# Patient Record
Sex: Male | Born: 1960
Health system: Southern US, Community
[De-identification: ages and names within clinical notes are randomized; demographics above are authoritative.]

## PROBLEM LIST (undated history)

## (undated) DIAGNOSIS — S59909A Unspecified injury of unspecified elbow, initial encounter: Secondary | ICD-10-CM

## (undated) DIAGNOSIS — F329 Major depressive disorder, single episode, unspecified: Secondary | ICD-10-CM

## (undated) DIAGNOSIS — G546 Phantom limb syndrome with pain: Secondary | ICD-10-CM

## (undated) DIAGNOSIS — F32A Depression, unspecified: Secondary | ICD-10-CM

## (undated) DIAGNOSIS — I10 Essential (primary) hypertension: Secondary | ICD-10-CM

## (undated) HISTORY — DX: Essential (primary) hypertension: I10

## (undated) HISTORY — DX: Depression, unspecified: F32.A

## (undated) HISTORY — DX: Unspecified injury of unspecified elbow, initial encounter: S59.909A

## (undated) HISTORY — DX: Major depressive disorder, single episode, unspecified: F32.9

## (undated) HISTORY — DX: Phantom limb syndrome with pain: G54.6

---

## 2005-07-24 ENCOUNTER — Ambulatory Visit: Payer: Self-pay | Admitting: Internal Medicine

## 2006-10-18 ENCOUNTER — Ambulatory Visit: Payer: Self-pay | Admitting: Internal Medicine

## 2006-10-19 ENCOUNTER — Telehealth (INDEPENDENT_AMBULATORY_CARE_PROVIDER_SITE_OTHER): Payer: Self-pay | Admitting: *Deleted

## 2006-10-30 ENCOUNTER — Telehealth: Payer: Self-pay | Admitting: Internal Medicine

## 2006-11-19 ENCOUNTER — Telehealth (INDEPENDENT_AMBULATORY_CARE_PROVIDER_SITE_OTHER): Payer: Self-pay | Admitting: *Deleted

## 2006-12-04 ENCOUNTER — Ambulatory Visit: Payer: Self-pay | Admitting: Internal Medicine

## 2006-12-04 LAB — CONVERTED CEMR LAB
Bacteria, UA: NEGATIVE
Ketones, ur: NEGATIVE mg/dL
Leukocytes, UA: NEGATIVE
RBC / HPF: NONE SEEN
Urobilinogen, UA: 0.2 (ref 0.0–1.0)

## 2006-12-05 ENCOUNTER — Encounter: Payer: Self-pay | Admitting: Internal Medicine

## 2006-12-05 ENCOUNTER — Ambulatory Visit: Payer: Self-pay | Admitting: Cardiology

## 2006-12-06 ENCOUNTER — Encounter (INDEPENDENT_AMBULATORY_CARE_PROVIDER_SITE_OTHER): Payer: Self-pay | Admitting: *Deleted

## 2006-12-13 ENCOUNTER — Ambulatory Visit: Payer: Self-pay | Admitting: Internal Medicine

## 2006-12-13 DIAGNOSIS — F329 Major depressive disorder, single episode, unspecified: Secondary | ICD-10-CM

## 2006-12-24 ENCOUNTER — Ambulatory Visit: Payer: Self-pay | Admitting: Internal Medicine

## 2006-12-28 ENCOUNTER — Encounter: Payer: Self-pay | Admitting: Internal Medicine

## 2007-01-02 ENCOUNTER — Encounter (INDEPENDENT_AMBULATORY_CARE_PROVIDER_SITE_OTHER): Payer: Self-pay | Admitting: *Deleted

## 2007-04-10 ENCOUNTER — Ambulatory Visit: Payer: Self-pay | Admitting: Internal Medicine

## 2007-05-16 ENCOUNTER — Telehealth (INDEPENDENT_AMBULATORY_CARE_PROVIDER_SITE_OTHER): Payer: Self-pay | Admitting: *Deleted

## 2007-06-11 ENCOUNTER — Encounter (INDEPENDENT_AMBULATORY_CARE_PROVIDER_SITE_OTHER): Payer: Self-pay | Admitting: *Deleted

## 2007-06-18 DIAGNOSIS — F411 Generalized anxiety disorder: Secondary | ICD-10-CM | POA: Insufficient documentation

## 2007-06-20 ENCOUNTER — Telehealth (INDEPENDENT_AMBULATORY_CARE_PROVIDER_SITE_OTHER): Payer: Self-pay | Admitting: *Deleted

## 2007-07-31 ENCOUNTER — Telehealth (INDEPENDENT_AMBULATORY_CARE_PROVIDER_SITE_OTHER): Payer: Self-pay | Admitting: *Deleted

## 2007-08-27 ENCOUNTER — Emergency Department (HOSPITAL_COMMUNITY): Admission: EM | Admit: 2007-08-27 | Discharge: 2007-08-27 | Payer: Self-pay | Admitting: Emergency Medicine

## 2007-09-11 ENCOUNTER — Telehealth: Payer: Self-pay | Admitting: Internal Medicine

## 2007-10-11 ENCOUNTER — Telehealth (INDEPENDENT_AMBULATORY_CARE_PROVIDER_SITE_OTHER): Payer: Self-pay | Admitting: *Deleted

## 2007-11-29 ENCOUNTER — Ambulatory Visit: Payer: Self-pay | Admitting: Internal Medicine

## 2007-12-23 ENCOUNTER — Telehealth: Payer: Self-pay | Admitting: Internal Medicine

## 2008-01-19 ENCOUNTER — Emergency Department: Payer: Self-pay | Admitting: Emergency Medicine

## 2008-01-19 ENCOUNTER — Encounter: Payer: Self-pay | Admitting: Family Medicine

## 2008-01-20 ENCOUNTER — Ambulatory Visit: Payer: Self-pay | Admitting: Family Medicine

## 2008-01-20 ENCOUNTER — Encounter (INDEPENDENT_AMBULATORY_CARE_PROVIDER_SITE_OTHER): Payer: Self-pay | Admitting: *Deleted

## 2008-01-21 ENCOUNTER — Ambulatory Visit: Payer: Self-pay | Admitting: Cardiovascular Disease

## 2008-01-21 ENCOUNTER — Encounter: Payer: Self-pay | Admitting: Family Medicine

## 2008-01-24 ENCOUNTER — Ambulatory Visit: Payer: Self-pay | Admitting: Family Medicine

## 2008-01-24 DIAGNOSIS — S59909A Unspecified injury of unspecified elbow, initial encounter: Secondary | ICD-10-CM

## 2008-01-24 HISTORY — DX: Unspecified injury of unspecified elbow, initial encounter: S59.909A

## 2008-01-29 ENCOUNTER — Ambulatory Visit: Payer: Self-pay | Admitting: Family Medicine

## 2008-02-14 ENCOUNTER — Telehealth: Payer: Self-pay | Admitting: Internal Medicine

## 2008-05-14 ENCOUNTER — Ambulatory Visit: Payer: Self-pay | Admitting: Family Medicine

## 2008-05-15 ENCOUNTER — Encounter: Payer: Self-pay | Admitting: Family Medicine

## 2008-05-15 ENCOUNTER — Ambulatory Visit: Payer: Self-pay | Admitting: Family Medicine

## 2008-05-21 ENCOUNTER — Ambulatory Visit: Payer: Self-pay | Admitting: Family Medicine

## 2008-07-13 ENCOUNTER — Telehealth: Payer: Self-pay | Admitting: Internal Medicine

## 2008-08-20 ENCOUNTER — Ambulatory Visit: Payer: Self-pay | Admitting: Family Medicine

## 2008-08-20 DIAGNOSIS — Z720 Tobacco use: Secondary | ICD-10-CM | POA: Insufficient documentation

## 2008-08-20 DIAGNOSIS — L738 Other specified follicular disorders: Secondary | ICD-10-CM

## 2008-08-21 ENCOUNTER — Telehealth (INDEPENDENT_AMBULATORY_CARE_PROVIDER_SITE_OTHER): Payer: Self-pay | Admitting: Internal Medicine

## 2008-10-08 ENCOUNTER — Telehealth: Payer: Self-pay | Admitting: Internal Medicine

## 2008-11-06 ENCOUNTER — Telehealth (INDEPENDENT_AMBULATORY_CARE_PROVIDER_SITE_OTHER): Payer: Self-pay | Admitting: Internal Medicine

## 2009-01-15 ENCOUNTER — Telehealth: Payer: Self-pay | Admitting: Internal Medicine

## 2009-09-19 IMAGING — CT CT EXTREM UP W/O CM*L*
2 of 3 series · 17 of 46 positions shown, 19 images · non-contrast
Comparison: None available

CLINICAL DATA: Elbow fracture.  Severe pain.  Swelling.

CT LEFT ELBOW WITHOUT CONTRAST
TECHNIQUE: Multidetector CT imaging of the left elbow was
performed according to the standard protocol without intravenous
contrast. Multiplanar CT image reconstructions were also generated.

[Series 3: pelvis_hip 3.0 b60f st · axial · 0.28mm/px · z∈[-152,-30]mm · 14 of 47 slices shown, 16 images]
[im 3/47  soft-tissue]
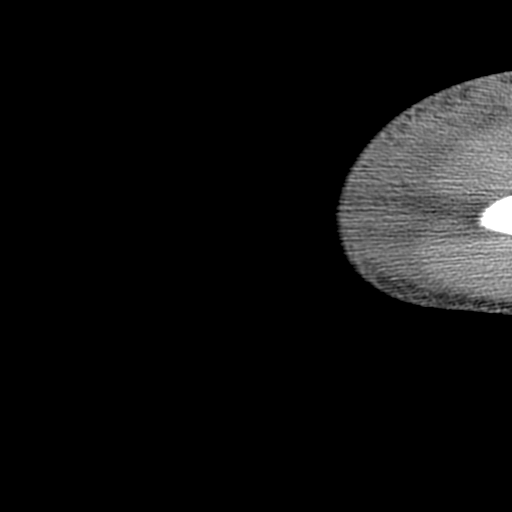
[im 3/47  bone]
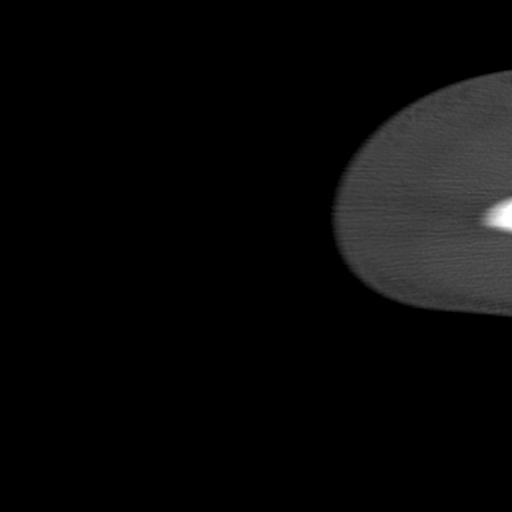
[im 6/47  soft-tissue]
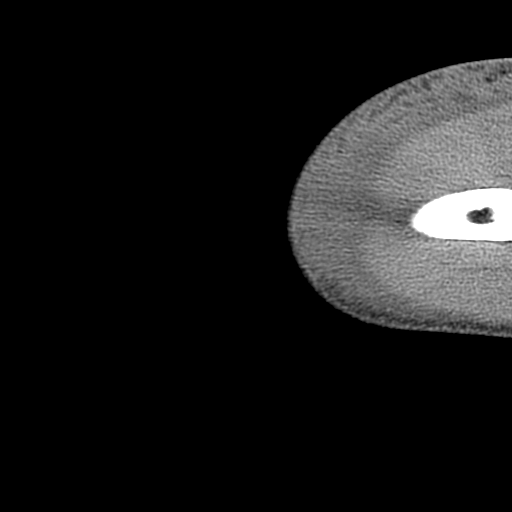
[im 9/47  soft-tissue]
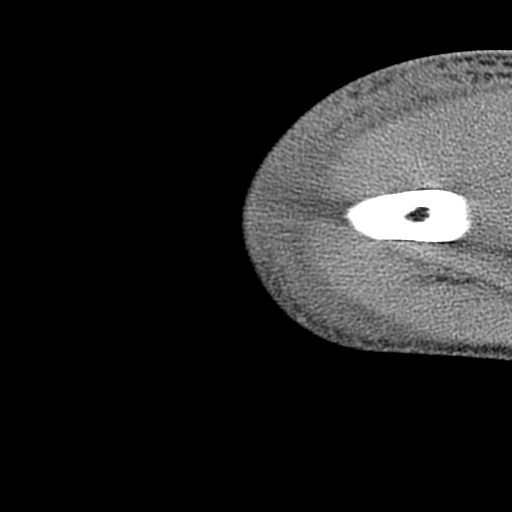
[im 12/47  soft-tissue]
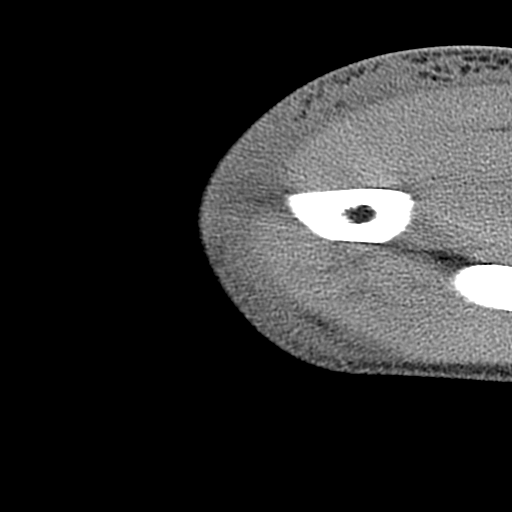
[im 15/47  soft-tissue]
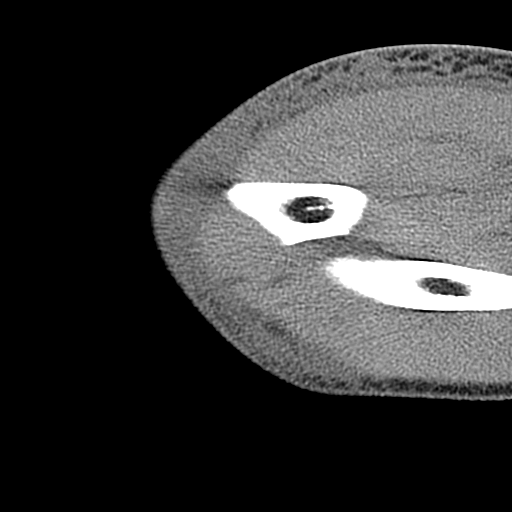
[im 18/47  soft-tissue]
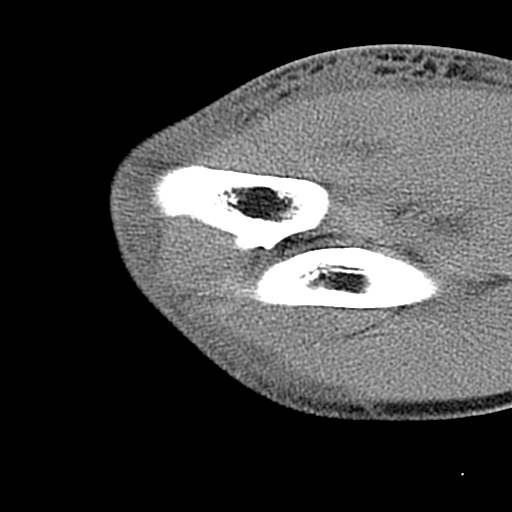
[im 21/47  soft-tissue]
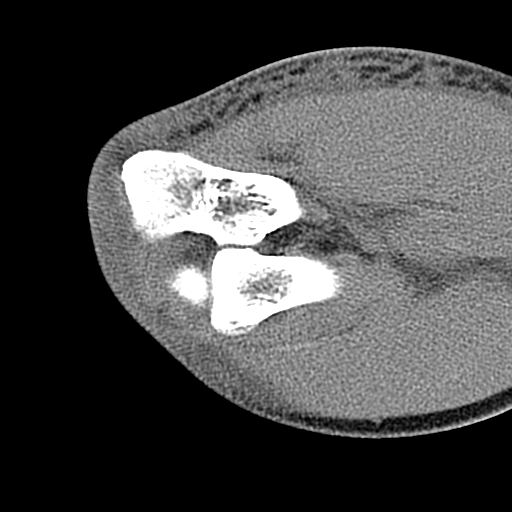
[im 26/47  soft-tissue]
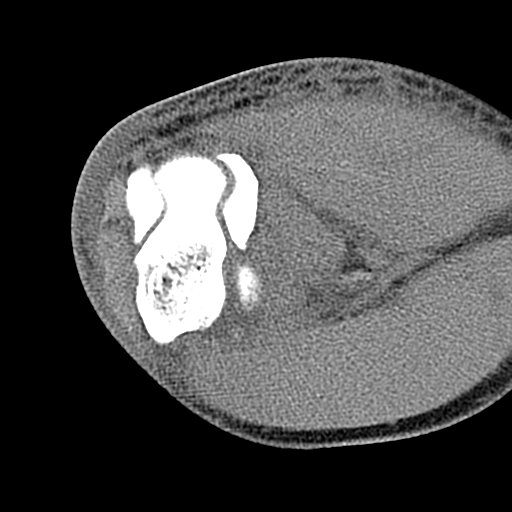
[im 29/47  soft-tissue]
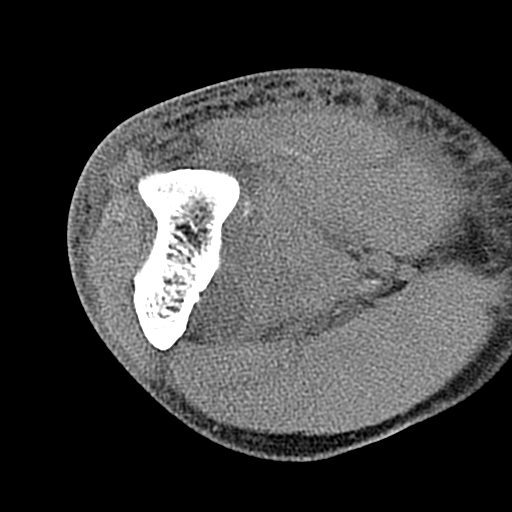
[im 29/47  bone]
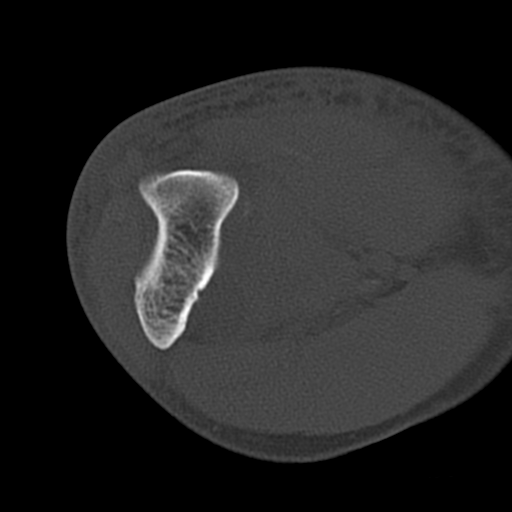
[im 32/47  soft-tissue]
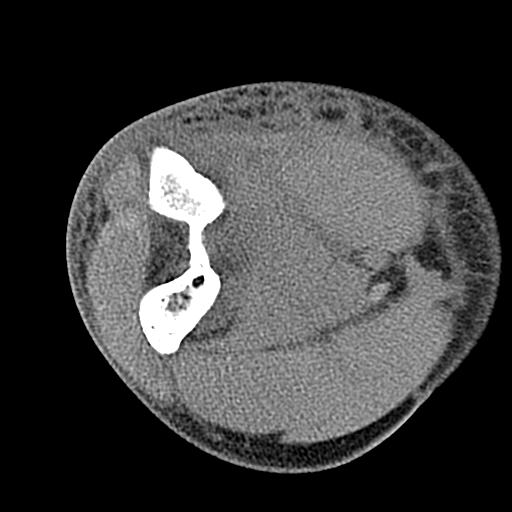
[im 35/47  soft-tissue]
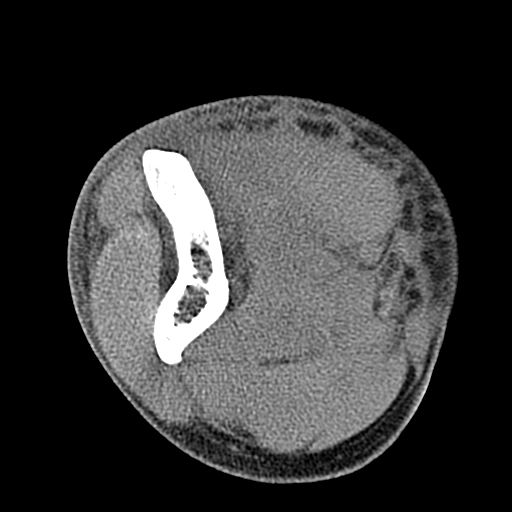
[im 38/47  soft-tissue]
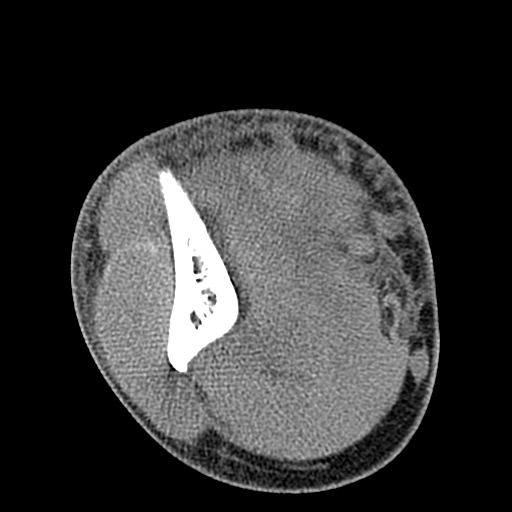
[im 41/47  soft-tissue]
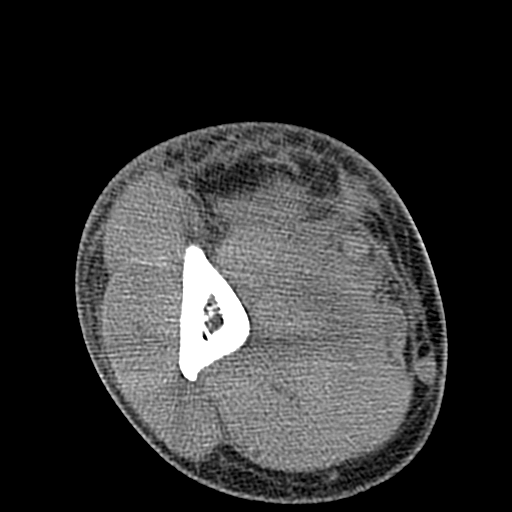
[im 44/47  soft-tissue]
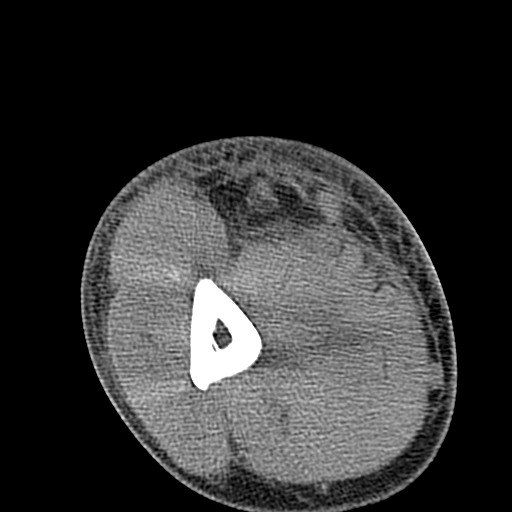

[Series 602: sagittal left elbow · coronal · 0.29mm/px · 3 of 62 slices shown]
[im 21/62  soft-tissue]
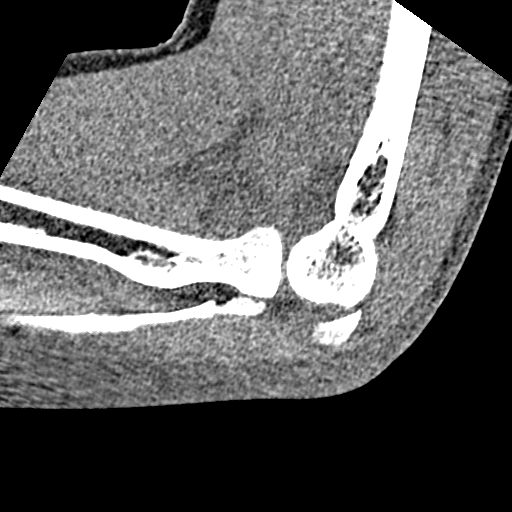
[im 28/62  soft-tissue]
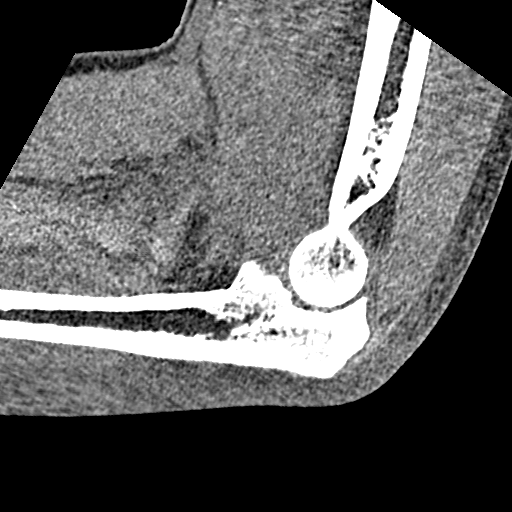
[im 34/62  soft-tissue]
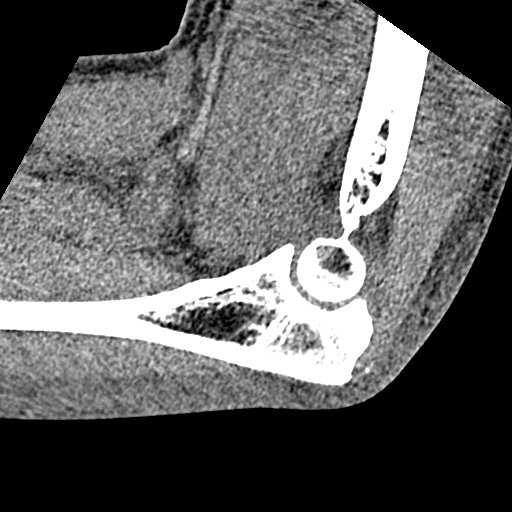

[17 of 46 positions shown; findings below may reference images not displayed]

FINDINGS: Bones are intact.  No fracture is identified.  Tiny
amount of calcium is present in the distal triceps tendon at the
insertion on the olecranon, most consistent with enthesopathy.  No
significant elbow effusion.  Fluid is present in the subcutaneous
tissues of the anterior, ulnar, and posterior aspect of the elbow.
Grossly the triceps and biceps tendons appear within normal limits
although CT is limited in evaluation of soft tissues.  If  ligament
or tendinous injury is suspected, MRI should be obtained.  Edema is
present around the cubital tunnel.  No mass lesion is present.  No
subcutaneous hematoma.
IMPRESSION: No fracture.  Subcutaneous edema and soft tissue swelling about the
elbow.

## 2009-10-27 ENCOUNTER — Encounter: Payer: Self-pay | Admitting: Family Medicine

## 2009-10-27 ENCOUNTER — Ambulatory Visit: Payer: Self-pay | Admitting: Family Medicine

## 2010-04-28 NOTE — Letter (Signed)
Summary: Out of Work  [facility]  [address] [place], [place] [address]   Phone: [phone]  Fax: [phone]    October 27, 2009   Employee:  NYKOLAS BACALLAO    To Whom It May Concern:   For Medical reasons, please excuse the above named employee from work for the following dates:  Start:  October 26, 2009   End:  October 27, 2009   If you need additional information, please feel free to contact our office.         Sincerely,    Eustaquio Boyden  MD

## 2010-04-28 NOTE — Assessment & Plan Note (Signed)
Summary: ABD PAIN   Vital Signs:  Patient profile:   50 year old male Height:      69 inches Weight:      168.75 pounds BMI:     25.01 Temp:     97.9 degrees F oral Pulse rate:   72 / minute Pulse rhythm:   regular BP sitting:   142 / 72  (left arm) Cuff size:   regular  Vitals Entered By: Selena Batten Dance CMA Duncan Dull) (October 27, 2009 12:26 PM) CC: Left shoulder pain x 3 weeks   History of Present Illness: CC: L rib pain  3-4 wk h/o left rib pain below axilla and scapula.  No noted food/change that started pain.  Characterized as soreness below ribs.  Feels like reflux/gas, needs to burp.  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  Improved today, but stayed at home on couch yesterday.  Tums doesn't help.  Alka seltzer helps for a few seconds.  Gets worse throughout day, at times positional (worse with reaching left posteriorly).  Thinks diazepam helped in past.  Wonders if caused by stress.  Diet - chicken, bread (whole wheat).  Low on fruits and vegetables.  Does drink water or V8 instead of sodas.  No fevers, chills, abd pain otherwise, n/v/d/c.  No weight changes, appetite changes.  Nl urination.  No blood in stool/urine.  No chest pain/pressure/tightness, cough, SOB.  Previously had similar pain, went to ER and had rapid improvement with gi cocktail.  Smokes 1 ppd.  EtOH on weekends.  Did have colonoscopy 2008 per pt WNL.  Current Medications (verified): 1)  Ranitidine Hcl 150 Mg Caps (Ranitidine Hcl) .... One Twice Daily For Reflux 2)  Gas-X Extra Strength 125 Mg Chew (Simethicone) .... One After Meals As Needed  Allergies: 1)  ! * Cortisporin Ear Drops PMH reviewed for relevance  Review of Systems       per HPI  Physical Exam  General:  alert, well-developed, well-nourished, and well-hydrated.   Chest Wall:  No deformities, masses, tenderness or gynecomastia noted.  No tenderness currently on palpation of left lateral ribs Lungs:  normal respiratory effort, no intercostal  retractions, no accessory muscle use, normal breath sounds, no crackles, and no wheezes.   Heart:  Normal rate and regular rhythm. S1 and S2 normal without gallop, murmur, click, rub or other extra sounds. Abdomen:  Bowel sounds positive,abdomen soft and non-tender without masses, organomegaly or hernias noted.  Negative murphy. Extremities:  No clubbing, cyanosis, edema, or deformity noted with normal full range of motion of all joints.     Impression & Recommendations:  Problem # 1:  ABDOMINAL PAIN (ICD-789.00) likely indigestion/bloating with referred pain, treat with ranitidine/simethicone (gas-x OTC).  RTC in 2 wks if not better, sooner if worsening.  Red flags to return sooner discussed.  If not improved on current regimen, would consider trial of valium both for anxiety and as MR (in case pulled intercostal, although exam not impressive for that today).  advised not to increase fiber during acute flare, but consider in future.  His updated medication list for this problem includes:    Gas-x Extra Strength 125 Mg Chew (Simethicone) ..... One after meals as needed  Complete Medication List: 1)  Ranitidine Hcl 150 Mg Caps (Ranitidine hcl) .... One twice daily for reflux 2)  Gas-x Extra Strength 125 Mg Chew (Simethicone) .... One after meals as needed  Patient Instructions: 1)  I think you have bad indigestion irritating your stomache. 2)  Ranitidine twice daily and mylanta daily for 10 days, then as needed. 3)  If no improvmenet come back to see [place].  See [place] sooner if pain is worsening, if you start having fevers/ vomiting. 4)  Pleasure to see you today! 5)  Call clinic with questions. Prescriptions: RANITIDINE HCL 150 MG CAPS (RANITIDINE HCL) one twice daily for reflux  #30 x 0   Entered and Authorized by:   Eustaquio Boyden  MD   Signed by:   Eustaquio Boyden  MD on 10/27/2009   Method used:   Electronically to        [facility] [address])       [address], [address]       [place], [place]  [address]       Ph: [phone]       Fax: [phone]   RxID:   [id number] GAS-X EXTRA STRENGTH 125 MG CHEW (SIMETHICONE) one after meals as needed  #30 x 0   Entered and Authorized by:   Eustaquio Boyden  MD   Signed by:   Eustaquio Boyden  MD on 10/27/2009   Method used:   Electronically to        [facility] [address] (retail)       [address], [address]       [place], [place]  [address]       Ph: [phone]       Fax: [phone]   RxID:   [id number]   Prior Medications (reviewed today): None Current Allergies (reviewed today): ! * CORTISPORIN EAR DROPS

## 2010-05-17 ENCOUNTER — Ambulatory Visit (INDEPENDENT_AMBULATORY_CARE_PROVIDER_SITE_OTHER): Payer: PRIVATE HEALTH INSURANCE | Admitting: Internal Medicine

## 2010-05-17 ENCOUNTER — Encounter: Payer: Self-pay | Admitting: Internal Medicine

## 2010-05-17 DIAGNOSIS — F411 Generalized anxiety disorder: Secondary | ICD-10-CM

## 2010-05-17 DIAGNOSIS — F329 Major depressive disorder, single episode, unspecified: Secondary | ICD-10-CM

## 2010-05-17 DIAGNOSIS — L738 Other specified follicular disorders: Secondary | ICD-10-CM

## 2010-05-24 ENCOUNTER — Encounter: Payer: Self-pay | Admitting: Internal Medicine

## 2010-05-24 LAB — HM COLONOSCOPY

## 2010-05-24 NOTE — Assessment & Plan Note (Signed)
Summary: FOLLOW UP ON DEPRESSION  MEDICATION...   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 Nervous about flying  Divorced for some time Friends with ex and good relationship  Some degree of anhedonia works 6-7 days per week Stays tired Gets so "wound up" he can't wind down Will take a beer and it does calm him some but he doesn't want this every night  No temper problems no persistent depressed mood  having sore areas in axilla used cream in the past  Allergies: 1)  ! * Cortisporin Ear Drops  Past History:  Past medical, surgical, family and social histories (including risk factors) reviewed for relevance to current acute and chronic problems.  Past Medical History: Reviewed history from 01/24/2008 and no changes required. Depression L elbow injury, 01/24/08  Family History: Reviewed history from 06/06/2007 and no changes required. Father: Alive, ? BPH Mother: Alive Siblings: 2 brothers living, one deceased at age 39 with melanoma No CAD, HTN, DM Prostate cancer ?? 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  NAD Chest Wall:  no axillary masses Slight irritation but no  folliculitis Psych:  normally interactive, not anxious appearing, and slightly anxious.  Not really great eye contact but open, normal conversation   Impression & Recommendations:  Problem # 1:  ANXIETY (ICD-300.00) Assessment Deteriorated  having more problems some help from meds in past Lots of job stress  will restart the fluoxetine diazepam for as needed only (had used it qAM in past)  His updated medication list for this problem includes:    Fluoxetine Hcl 20 Mg Caps (Fluoxetine hcl) .Marland Kitchen... 1 daily to help nerves    Diazepam 5 Mg Tabs (Diazepam) .Marland Kitchen... 1/2-1 tab by mouth two times a day as needed for nerves  Problem # 2:  DEPRESSION (ICD-311) Assessment: Comment Only  may have ongoing issues with this but he denies may just be secondary to anxiety will restart the fluoxetine  His updated medication list for this problem includes:    Fluoxetine Hcl 20 Mg Caps (Fluoxetine hcl) .Marland Kitchen... 1 daily to help nerves    Diazepam 5 Mg Tabs (Diazepam) .Marland Kitchen... 1/2-1 tab by mouth two times a day as needed for nerves  Problem # 3:  FOLLICULITIS (ICD-704.8) Assessment: Comment Only no major findings now discussed deodorant, etc to minimize irritation  Complete Medication List: 1)  Fluoxetine Hcl 20 Mg Caps (Fluoxetine hcl) .Marland Kitchen.. 1 daily to help nerves 2)  Diazepam 5 Mg Tabs (Diazepam) .... 1/2-1 tab by mouth two times a day as needed for nerves  Patient Instructions: 1)  Please schedule a  follow-up appointment in 1 month.  2)  Call if you have any problems back on the medications Prescriptions: DIAZEPAM 5 MG TABS (DIAZEPAM) 1/2-1 tab by mouth two times a day as needed for nerves  #60 x 0   Entered and Authorized by:   Cindee Salt MD   Signed by:   Cindee Salt MD on 05/17/2010   Method used:   Print then Give to Patient   RxID:   [id number] FLUOXETINE HCL 20 MG CAPS (FLUOXETINE HCL) 1 daily to help nerves  #30 x 5   Entered and Authorized by:   Cindee Salt MD    Signed by:   Cindee Salt MD on 05/17/2010   Method used:   Electronically to        [facility] [address] ([facility]ail)       [address], [address]       [place], [place]  [address]       Ph: [phone]       Fax: [phone]   RxID:   [id number]    Orders Added: 1)  Est. Patient Level IV [59563]    Prior Medications: Current Allergies (reviewed today): ! * CORTISPORIN EAR DROPS

## 2010-06-15 ENCOUNTER — Ambulatory Visit: Payer: PRIVATE HEALTH INSURANCE | Admitting: Internal Medicine

## 2010-07-08 ENCOUNTER — Ambulatory Visit: Payer: PRIVATE HEALTH INSURANCE | Admitting: Internal Medicine

## 2010-07-13 ENCOUNTER — Other Ambulatory Visit: Payer: Self-pay | Admitting: *Deleted

## 2010-07-13 MED ORDER — DIAZEPAM 5 MG PO TABS: ORAL_TABLET | ORAL | Status: DC

## 2010-07-13 NOTE — Telephone Encounter (Signed)
rx called into pharmacy

## 2010-07-13 NOTE — Telephone Encounter (Signed)
Okay #60 x 0 

## 2010-07-20 ENCOUNTER — Ambulatory Visit (INDEPENDENT_AMBULATORY_CARE_PROVIDER_SITE_OTHER): Payer: PRIVATE HEALTH INSURANCE | Admitting: Internal Medicine

## 2010-07-20 ENCOUNTER — Encounter: Payer: Self-pay | Admitting: Internal Medicine

## 2010-07-20 VITALS — BP 118/70 | HR 78 | Temp 97.7°F | Ht 69.0 in | Wt 172.0 lb

## 2010-07-20 DIAGNOSIS — F411 Generalized anxiety disorder: Secondary | ICD-10-CM

## 2010-07-20 DIAGNOSIS — F3289 Other specified depressive episodes: Secondary | ICD-10-CM

## 2010-07-20 DIAGNOSIS — F329 Major depressive disorder, single episode, unspecified: Secondary | ICD-10-CM

## 2010-07-20 NOTE — Patient Instructions (Signed)
Please try the fluoxetine again---take it every other day. Stick with it for at least 2 weeks--I expect any side effects like heartburn should fade away

## 2010-07-20 NOTE — Progress Notes (Signed)
  Subjective:    Patient ID: Dylan Mckenzie, male    DOB: 1961-01-25, 50 y.o.   MRN: [id number]  HPI Feels a lot better Nerves are much improved "I just go with the flow" Taking diazepam only as needed--about 1/2 per day on average  Took the fluoxetine for 1 week only Felt it caused him AM heartburn  Work is better Didn't have to go to [place]  Still not depressed  Current outpatient prescriptions:diazepam (VALIUM) 5 MG tablet, 1/2 to 1 tablet by mouth two times a day as needed for nerves, Disp: 60 tablet, Rfl: 0;  FLUoxetine (PROZAC) 20 MG capsule, Take 20 mg by mouth daily.  , Disp: , Rfl:   Past Medical History  Diagnosis Date  . Depression   . Elbow injury 01/24/2008    left    No past surgical history on file.  Family History  Problem Relation Age of Onset  . Benign prostatic hyperplasia Father   . Cancer Father     prostate  . Cancer Brother 30    melanoma    History   Social History  . Marital Status: Married    Spouse Name: N/A    Number of Children: N/A  . Years of Education: N/A   Occupational History  . 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 Smoking status: Current Everyday Smoker  . Smokeless tobacco: Not on file  . Alcohol Use: Yes  . Drug Use:   . Sexually Active:    Other Topics Concern  . Not on file   Social History Narrative  . No narrative on file   Review of Systems Sleeping well Appetite has improved    Objective:   Physical Exam  Constitutional: He appears well-developed and well-nourished. No distress.  Psychiatric: He has a normal mood and affect. His behavior is normal. Judgment and thought content normal.          Assessment & Plan:

## 2010-08-09 NOTE — Assessment & Plan Note (Signed)
[facility]                         GASTROENTEROLOGY OFFICE NOTE   INDIO, SANTILLI                     MRN:          [id number]  DATE:12/04/2006                            DOB:          [birth year]    REFERRING PHYSICIAN:  Karie Schwalbe, MD   OFFICE CONSULTATION NOTE   REASON FOR CONSULTATION:  Lower abdominal pain.   HISTORY:  This is a 50 year old white male with no significant past  medical history who is referred through the courtesy of Dr. Alphonsus Sias  regarding abdominal pain.  The patient reports developing mid upper  abdominal pain about 2 months ago.  He was seen in Dr. Karle Starch office  October 18, 2006.  At that time, the pain was described as groin pain.   PHYSICAL EXAM:  Remarkable only for moderate prostate tenderness.  He  was treated with ciprofloxacin 500 mg b.i.d. for 3 weeks.  The patient  states the upper abdominal discomfort resolved.  However, since that  time, he has had bilateral lower abdominal discomfort.  The pain is  exacerbated when he stands on his feet for long periods of time, such as  at work.  It is better when he lies flat on his back.  His discomfort is  not effected by meals, defecation, urination, or other activities.  He  is here with his girlfriend who confirms that his problem is quite  severe.  He did contact the on-call doctor the day after his office  visit with Dr. Alphonsus Sias, who suggested emergency room evaluation.  The  patient did not feel his problem was quite that severe.  The patient  denies weight loss or rectal bleeding.   PAST MEDICAL HISTORY:  None.   PAST SURGICAL HISTORY:  None.   ALLERGIES:  None.   CURRENT MEDICATIONS:  None.   FAMILY HISTORY:  Negative for gastrointestinal malignancy.   SOCIAL HISTORY:  The patient is divorced with 1 daughter.  He lives  alone.  He is accompanied by his girlfriend.  He has a high school  education.  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   Smokes a pack  of cigarettes a day.  Drinks alcohol in the evenings and weekends.   REVIEW OF SYSTEMS:  Problems with anxiety and depression.   PHYSICAL EXAM:  Pleasant, well-appearing male in no acute distress.  Blood pressure 114/76, heart rate is 64 and regular, weight 156 pounds.  He is 5 feet 8 inches in height.  HEENT:  Sclerae anicteric, conjunctivae pink.  Oral mucosa intact.  No  adenopathy.  LUNGS:  Clear.  HEART:  Regular.  ABDOMEN:  Soft with complaints of tenderness to palpation in the lower  quadrants bilaterally as well as the suprapubic region.  RECTAL:  Omitted (performed in July).  EXTREMITIES:  Without edema.   IMPRESSION:  A 50 year old with persistent lower abdominal and pelvic  discomfort of uncertain etiology.  Questionable response to  ciprofloxacin.  Today's exam is remarkable for discomfort to palpation,  though no rebound or mass.  Question primary gastrointestinal process.  Question urologic process.   RECOMMENDATIONS:  1. Urinalysis.  2. Contrast-enhanced CT scan of the abdomen and pelvis.  3. Colonoscopy.  The nature of the procedure, as well as the risks,      benefits, and alternatives were discussed in detail.  He understood      and agreed to proceed.  4. If the above evaluation is negative, then return to Dr. Alphonsus Sias for      consideration of other etiologies.     Wilhemina Bonito. Marina Goodell, MD  Electronically Signed    JNP/MedQ  DD: 12/04/2006  DT: 12/04/2006  Job #: [id number]   cc:   Karie Schwalbe, MD

## 2010-08-12 NOTE — Assessment & Plan Note (Signed)
[facility]                                 ON-CALL NOTE   Dylan Mckenzie, Dylan Mckenzie                       MRN:          [id number]  DATE:10/19/2006                            DOB:          06/02/1960    TIME OF CALL:  7:17pm   CALLER:  Mrs. Antillon.   PRIMARY CARE PHYSICIAN:  Dr. Alphonsus Sias.   TELEPHONE NUMBER:  [phone]   The patient has had severe abdominal pain for several days and it is  much worse tonight. There is some nausea and vomiting, but no fever. He  was seen in the office several days ago and given some type of  antibiotics, however he is much worse tonight and is becoming quite  weak. My advice is to take him to the emergency room immediately.     Tera Mater. Clent Ridges, MD  Electronically Signed    SAF/MedQ  DD: 10/19/2006  DT: 10/21/2006  Job #: [id number]

## 2010-08-30 ENCOUNTER — Other Ambulatory Visit: Payer: Self-pay | Admitting: *Deleted

## 2010-08-30 MED ORDER — DIAZEPAM 5 MG PO TABS: ORAL_TABLET | ORAL | Status: DC

## 2010-08-30 NOTE — Telephone Encounter (Signed)
rx faxed to pharmacy manually  

## 2010-11-01 ENCOUNTER — Ambulatory Visit: Payer: PRIVATE HEALTH INSURANCE | Admitting: Internal Medicine

## 2010-11-01 DIAGNOSIS — Z0289 Encounter for other administrative examinations: Secondary | ICD-10-CM

## 2010-11-21 ENCOUNTER — Other Ambulatory Visit: Payer: Self-pay | Admitting: *Deleted

## 2010-11-21 MED ORDER — DIAZEPAM 5 MG PO TABS: ORAL_TABLET | ORAL | Status: DC

## 2010-11-21 NOTE — Telephone Encounter (Signed)
Form on your desk  

## 2010-11-21 NOTE — Telephone Encounter (Signed)
rx faxed to pharmacy manually  

## 2010-11-21 NOTE — Telephone Encounter (Signed)
Okay #60 x 0 Needs to schedule follow up in the next 1-2 months

## 2011-01-27 ENCOUNTER — Other Ambulatory Visit: Payer: Self-pay | Admitting: *Deleted

## 2011-01-27 MED ORDER — DIAZEPAM 5 MG PO TABS: ORAL_TABLET | ORAL | Status: DC

## 2011-01-27 NOTE — Telephone Encounter (Signed)
Okay #60 x 0 

## 2011-01-27 NOTE — Telephone Encounter (Signed)
rx called into pharmacy

## 2011-03-28 HISTORY — PX: OTHER SURGICAL HISTORY: SHX169

## 2011-04-03 ENCOUNTER — Ambulatory Visit: Payer: PRIVATE HEALTH INSURANCE | Admitting: Family Medicine

## 2011-04-03 ENCOUNTER — Ambulatory Visit (INDEPENDENT_AMBULATORY_CARE_PROVIDER_SITE_OTHER): Payer: PRIVATE HEALTH INSURANCE | Admitting: Family Medicine

## 2011-04-03 ENCOUNTER — Encounter: Payer: Self-pay | Admitting: Family Medicine

## 2011-04-03 VITALS — BP 110/70 | HR 94 | Temp 97.7°F | Wt 175.8 lb

## 2011-04-03 DIAGNOSIS — J069 Acute upper respiratory infection, unspecified: Secondary | ICD-10-CM

## 2011-04-03 MED ORDER — HYDROCOD POLST-CHLORPHEN POLST 10-8 MG/5ML PO LQCR: 5.0000 mL | Freq: Every evening | ORAL | Status: DC | PRN

## 2011-04-03 NOTE — Patient Instructions (Signed)
This is probably a virus.  Drink lots of fluids.  Treat sympotmatically with Mucinex, nasal saline irrigation, and Tylenol/Ibuprofen. Cough suppressant at night. Call if not improving as expected in 5-7 days.

## 2011-04-03 NOTE — Progress Notes (Signed)
SUBJECTIVE:  Dylan Mckenzie is a 51 y.o. male who complains of coryza, congestion, myalgias, headache and fever for 5 days. He denies a history of chest pain, chills, dizziness, shortness of breath, sweats, vomiting, weakness and wheezing and denies a history of asthma. Patient admits to smoke cigarettes.   Patient Active Problem List  Diagnoses  . ANXIETY  . TOBACCO ABUSE  . DEPRESSION  . FOLLICULITIS   Past Medical History  Diagnosis Date  . Depression   . Elbow injury 01/24/2008    left   No past surgical history on file. History  Substance Use Topics  . Smoking status: Current Everyday Smoker  . Smokeless tobacco: Not on file  . Alcohol Use: Yes   Family History  Problem Relation Age of Onset  . Benign prostatic hyperplasia Father   . Cancer Father     prostate  . Cancer Brother 30    melanoma   No Known Allergies Current Outpatient Prescriptions on File Prior to Visit  Medication Sig Dispense Refill  . diazepam (VALIUM) 5 MG tablet 1/2 to 1 tablet by mouth two times a day as needed for nerves  60 tablet  0  . FLUoxetine (PROZAC) 20 MG capsule Take 20 mg by mouth daily.         The PMH, PSH, Social History, Family History, Medications, and allergies have been reviewed in [facility], and have been updated if relevant.  OBJECTIVE: BP 110/70  Pulse 94  Temp(Src) 97.7 F (36.5 C) (Oral)  Wt 175 lb 12 oz (79.72 kg)  He appears well, vital signs are as noted. Ears normal.  Throat and pharynx normal.  Neck supple. No adenopathy in the neck. Nose is congested. Sinuses non tender. The chest is clear, without wheezes or rales.  ASSESSMENT:  viral upper respiratory illness  PLAN: Symptomatic therapy suggested: push fluids, rest and return office visit prn if symptoms persist or worsen. Lack of antibiotic effectiveness discussed with him. Call or return to clinic prn if these symptoms worsen or fail to improve as anticipated.

## 2011-06-27 ENCOUNTER — Other Ambulatory Visit: Payer: Self-pay | Admitting: *Deleted

## 2011-06-27 MED ORDER — DIAZEPAM 5 MG PO TABS: 2.5000 mg | ORAL_TABLET | Freq: Two times a day (BID) | ORAL | Status: DC | PRN

## 2011-06-27 NOTE — Telephone Encounter (Signed)
.  Spoke with patient and advised results, appt for CPX scheduled rx called into pharmacy

## 2011-06-27 NOTE — Telephone Encounter (Signed)
Okay #60 x 0 Have him set up PE in the next few months

## 2011-07-12 ENCOUNTER — Ambulatory Visit (INDEPENDENT_AMBULATORY_CARE_PROVIDER_SITE_OTHER): Payer: PRIVATE HEALTH INSURANCE | Admitting: Internal Medicine

## 2011-07-12 ENCOUNTER — Encounter: Payer: Self-pay | Admitting: Internal Medicine

## 2011-07-12 VITALS — BP 110/70 | HR 59 | Temp 98.3°F | Ht 69.0 in | Wt 177.0 lb

## 2011-07-12 DIAGNOSIS — F411 Generalized anxiety disorder: Secondary | ICD-10-CM

## 2011-07-12 DIAGNOSIS — Z0000 Encounter for general adult medical examination without abnormal findings: Secondary | ICD-10-CM | POA: Insufficient documentation

## 2011-07-12 DIAGNOSIS — F17200 Nicotine dependence, unspecified, uncomplicated: Secondary | ICD-10-CM

## 2011-07-12 DIAGNOSIS — Z23 Encounter for immunization: Secondary | ICD-10-CM

## 2011-07-12 LAB — LIPID PANEL: Triglycerides: 346 mg/dL — ABNORMAL HIGH (ref 0.0–149.0)

## 2011-07-12 LAB — BASIC METABOLIC PANEL
CO2: 26 mEq/L (ref 19–32)
Calcium: 9.1 mg/dL (ref 8.4–10.5)
Glucose, Bld: 94 mg/dL (ref 70–99)
Potassium: 4.2 mEq/L (ref 3.5–5.1)
Sodium: 141 mEq/L (ref 135–145)

## 2011-07-12 LAB — HEPATIC FUNCTION PANEL
ALT: 59 U/L — ABNORMAL HIGH (ref 0–53)
AST: 48 U/L — ABNORMAL HIGH (ref 0–37)
Alkaline Phosphatase: 75 U/L (ref 39–117)
Bilirubin, Direct: 0 mg/dL (ref 0.0–0.3)
Total Bilirubin: 0.4 mg/dL (ref 0.3–1.2)

## 2011-07-12 LAB — CBC WITH DIFFERENTIAL/PLATELET
Basophils Absolute: 0 10*3/uL (ref 0.0–0.1)
Eosinophils Absolute: 0.1 10*3/uL (ref 0.0–0.7)
HCT: 46.8 % (ref 39.0–52.0)
Hemoglobin: 15.9 g/dL (ref 13.0–17.0)
Lymphs Abs: 3.2 10*3/uL (ref 0.7–4.0)
MCHC: 33.9 g/dL (ref 30.0–36.0)
Monocytes Absolute: 0.6 10*3/uL (ref 0.1–1.0)
Neutro Abs: 5 10*3/uL (ref 1.4–7.7)
RDW: 14 % (ref 11.5–14.6)

## 2011-07-12 LAB — LDL CHOLESTEROL, DIRECT: Direct LDL: 61.3 mg/dL

## 2011-07-12 LAB — TSH: TSH: 0.69 u[IU]/mL (ref 0.35–5.50)

## 2011-07-12 NOTE — Assessment & Plan Note (Signed)
Counseling done 

## 2011-07-12 NOTE — Assessment & Plan Note (Signed)
Only occ problems Uses the diazepam very intermittently

## 2011-07-12 NOTE — Assessment & Plan Note (Signed)
Generally healthy Counseled 3 minutes on cigarette cessation

## 2011-07-12 NOTE — Progress Notes (Signed)
Subjective:    Patient ID: Dylan Mckenzie, male    DOB: 25-May-1960, 51 y.o.   MRN: [id number]  HPI Here for physical Not sure about smoking cessation Counseled on patches and lozenges Gave info on 1-800 QUIT NOW  No new concerns Has had some left back/flank pain Did do heavy above his head last week and noted this week Better with heat and advil  Lots of stress Only uses the diazepam very occasionally No persistent depressed feelings Does note some restlessness in legs at night---occ will use the diazepam Hasn't been using the fluoxetine  Current Outpatient Prescriptions on File Prior to Visit  Medication Sig Dispense Refill  . diazepam (VALIUM) 5 MG tablet Take 0.5-1 tablets (2.5-5 mg total) by mouth 2 (two) times daily as needed.  60 tablet  0    No Known Allergies  Past Medical History  Diagnosis Date  . Depression   . Elbow injury 01/24/2008    left    No past surgical history on file.  Family History  Problem Relation Age of Onset  . Benign prostatic hyperplasia Father   . Cancer Father     prostate  . Cancer Brother 30    melanoma    History   Social History  . Marital Status: Married    Spouse Name: N/A    Number of Children: N/A  . Years of Education: N/A   Occupational History  . 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 Smoking status: Current Everyday Smoker  . Smokeless tobacco: Never Used  . Alcohol Use: Yes  . Drug Use:   . Sexually Active:    Other Topics Concern  . Not on file   Social History Narrative  . No narrative on file   Review of Systems  Constitutional: Negative for fatigue and unexpected weight change.       Wears seat belt  HENT: Positive for sneezing. Negative for hearing loss, rhinorrhea, dental problem, postnasal drip and tinnitus.        Fairly regular with dentist Mild allergy symptoms with the pollen--no meds  Eyes: Negative for visual disturbance.       No diplopia or unilateral  vision loss  Respiratory: Negative for cough, chest tightness and shortness of breath.   Cardiovascular: Negative for chest pain, palpitations and leg swelling.  Gastrointestinal: Negative for nausea, vomiting, abdominal pain, constipation and blood in stool.       No heartburn  Genitourinary: Negative for urgency, frequency and difficulty urinating.       No sexual problems  Musculoskeletal: Positive for back pain. Negative for joint swelling and arthralgias.       Flank pain just recently  Skin: Negative for rash.       No suspicious lesions  Neurological: Negative for dizziness, syncope, weakness, light-headedness and headaches.  Hematological: Negative for adenopathy. Does not bruise/bleed easily.  Psychiatric/Behavioral: Positive for sleep disturbance. Negative for dysphoric mood. The patient is nervous/anxious.        Objective:   Physical Exam  Constitutional: He is oriented to person, place, and time. He appears well-developed and well-nourished. No distress.  HENT:  Head: Normocephalic and atraumatic.  Right Ear: External ear normal.  Left Ear: External ear normal.  Mouth/Throat: Oropharynx is clear and moist. No oropharyngeal exudate.  Eyes: Conjunctivae and EOM are normal. Pupils are equal, round, and reactive to light.  Neck: Normal range of motion. Neck supple. No thyromegaly present.  Cardiovascular: Normal rate,  regular rhythm, normal heart sounds and intact distal pulses.  Exam reveals no gallop.   No murmur heard. Pulmonary/Chest: Effort normal and breath sounds normal. No respiratory distress. He has no wheezes. He has no rales.  Abdominal: Soft. There is no tenderness.  Musculoskeletal: He exhibits no edema and no tenderness.  Lymphadenopathy:    He has no cervical adenopathy.  Neurological: He is alert and oriented to person, place, and time.  Skin: No rash noted. No erythema.  Psychiatric: He has a normal mood and affect. His behavior is normal. Thought  content normal.          Assessment & Plan:

## 2011-07-17 ENCOUNTER — Encounter: Payer: Self-pay | Admitting: *Deleted

## 2011-10-26 ENCOUNTER — Other Ambulatory Visit: Payer: Self-pay

## 2011-10-26 ENCOUNTER — Other Ambulatory Visit: Payer: Self-pay | Admitting: *Deleted

## 2011-10-26 NOTE — Telephone Encounter (Signed)
Ok to refill 

## 2011-10-26 NOTE — Telephone Encounter (Signed)
Okay #60 x 0 

## 2011-10-26 NOTE — Telephone Encounter (Signed)
Faxed refill request   

## 2011-10-26 NOTE — Telephone Encounter (Signed)
Rx called in as directed.   

## 2011-10-27 ENCOUNTER — Other Ambulatory Visit: Payer: Self-pay | Admitting: *Deleted

## 2011-10-27 MED ORDER — DIAZEPAM 5 MG PO TABS: 2.5000 mg | ORAL_TABLET | Freq: Two times a day (BID) | ORAL | Status: DC | PRN

## 2011-10-27 NOTE — Telephone Encounter (Signed)
Medication phoned to pharmacy.  

## 2011-10-27 NOTE — Telephone Encounter (Signed)
Okay #60 x 0 

## 2012-01-01 ENCOUNTER — Telehealth: Payer: Self-pay

## 2012-01-01 NOTE — Telephone Encounter (Signed)
noted 

## 2012-01-01 NOTE — Telephone Encounter (Signed)
Spoke with patient and advised results Wednesday is ok and he did have flu shot in the hospital.
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
## 2012-01-01 NOTE — Telephone Encounter (Signed)
The combination of the oxycodone and the diazepam is not a good idea---there are reports of increased risk of severe reactions (like dying) with this combination. I would avoid the diazepam for now  I can come out on Wednesday as I have another home visit in [place] area. Have them plan on me for about 3:30PM (give or take 15 minutes)

## 2012-01-03 ENCOUNTER — Encounter: Payer: Self-pay | Admitting: Internal Medicine

## 2012-01-03 ENCOUNTER — Ambulatory Visit: Payer: PRIVATE HEALTH INSURANCE | Admitting: Internal Medicine

## 2012-01-03 VITALS — BP 134/84 | HR 90 | Resp 24

## 2012-01-03 DIAGNOSIS — I10 Essential (primary) hypertension: Secondary | ICD-10-CM

## 2012-01-03 DIAGNOSIS — F411 Generalized anxiety disorder: Secondary | ICD-10-CM

## 2012-01-03 DIAGNOSIS — G546 Phantom limb syndrome with pain: Secondary | ICD-10-CM

## 2012-01-03 DIAGNOSIS — G547 Phantom limb syndrome without pain: Secondary | ICD-10-CM

## 2012-01-03 DIAGNOSIS — S88119A Complete traumatic amputation at level between knee and ankle, unspecified lower leg, initial encounter: Secondary | ICD-10-CM

## 2012-01-03 DIAGNOSIS — Z89511 Acquired absence of right leg below knee: Secondary | ICD-10-CM | POA: Insufficient documentation

## 2012-01-03 MED ORDER — OXYCODONE HCL 5 MG PO CAPS: 5.0000 mg | ORAL_CAPSULE | ORAL | Status: DC | PRN

## 2012-01-03 NOTE — Assessment & Plan Note (Signed)
Still getting dressings but looks good Will work towards healing that will allow prosthesis

## 2012-01-03 NOTE — Assessment & Plan Note (Signed)
Discussed---no diazepam while he is on the oxycodone
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
## 2012-01-03 NOTE — Assessment & Plan Note (Signed)
Getting the gabapentin May want to increase evening dose to 900mg  Will leave to orthopedist
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
## 2012-01-08 ENCOUNTER — Telehealth: Payer: Self-pay | Admitting: Internal Medicine

## 2012-01-08 ENCOUNTER — Other Ambulatory Visit: Payer: Self-pay

## 2012-01-08 MED ORDER — OXYCODONE HCL 5 MG PO CAPS: 5.0000 mg | ORAL_CAPSULE | ORAL | Status: DC | PRN
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
## 2012-01-08 NOTE — Telephone Encounter (Signed)
Patient notified as instructed by telephone rx ready for pick up at front desk.

## 2012-01-08 NOTE — Telephone Encounter (Signed)
Patient notified as instructed by telephone. Prescription left at front desk.  
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
## 2012-01-08 NOTE — Telephone Encounter (Signed)
Left v/m for pt to call back. Rx at front desk for pick up.

## 2012-01-16 ENCOUNTER — Other Ambulatory Visit: Payer: Self-pay | Admitting: *Deleted

## 2012-01-16 NOTE — Telephone Encounter (Addendum)
Last refilled 01/08/2012, pt's girlfriend states Dr. Edwena Blow from ortho @ [facility] has changed pt's rx to 2 tablets every 3 hours. She states Dr.Letvak wrote last rx and they can't drive to [place] to pick that up every time, would like Dr.Letvak to fill. please advise   Also added rx for faxed request for Hydrocodone that came later and wasn't on med list ( I added it) last filled 12/20/2011 for #60 [id number] [address] [place]

## 2012-01-17 ENCOUNTER — Other Ambulatory Visit: Payer: Self-pay

## 2012-01-17 NOTE — Telephone Encounter (Signed)
Spoke with patient and advised results, he will call back on 01/28/2012 for a refill. Patient understood.

## 2012-01-17 NOTE — Telephone Encounter (Signed)
Left message with [facility] @ Dr. Joylene Igo office to return my call. Called and spoke with patient to let him know what's going on and per pt his pain is not better and worse in the morning and at night.

## 2012-01-17 NOTE — Telephone Encounter (Signed)
Let him know I will not refill this for quite some time and I am very concerned that his request indicates inappropriate use of the oxycodone.  He is limited to 4 per day and since he has gotten #80 since 10/14, that means he will not be due till November 3rd It may be better to change to hydrocodone but I think we will be able to rapidly wean him off the narcotics

## 2012-01-17 NOTE — Telephone Encounter (Signed)
I just got Dr Joylene Igo note and it said his pain was better and continued improvement  Nothing about increasing meds If he really wanted him on more meds, patient will have to have Dr Edwena Blow call to confirm this or I can't write for more med  I really think he should stop the oxycodone as soon as possible and switch to hydrocodone---which doesn't require a written Rx

## 2012-01-17 NOTE — Telephone Encounter (Signed)
Opened in error; added note to 01/16/12 note.

## 2012-01-17 NOTE — Telephone Encounter (Signed)
Lady called and left v/m requesting oxycodone. Please return call [phone].

## 2012-01-17 NOTE — Telephone Encounter (Signed)
Spoke with Dylan Mckenzie at Dr.Ostrum's office and Dr.Ostrum did not increase med, pt got refill on 01/08/2012 for #30 from Dr.Letvak and 01/10/2012 #50  from Dr. Edwena Blow. They will not write for anymore pain meds, and all rx's would have to come from [facility].  Please advise

## 2012-01-17 NOTE — Telephone Encounter (Signed)
Dylan Mckenzie with Dr Edwena Blow at [facility] orthopedics left v/m requesting call back at [phone]; pts fiancee requesting refills on oxycodone; Dr Edwena Blow said pt should be getting from one physician.

## 2012-01-17 NOTE — Telephone Encounter (Signed)
Worsening pain now would probably be related to nerve pain (phantom limb). This will require increases in other meds--not the narcotics

## 2012-01-22 ENCOUNTER — Telehealth: Payer: Self-pay | Admitting: Internal Medicine

## 2012-01-22 NOTE — Telephone Encounter (Signed)
Have him increase the gabapentin to 900mg  at bedtime for 2 days and then up to 1200mg  if the pain isn't better. Okay to refill at higher dose if needed (300mg  bid , then the night dose)  I am not aware of a TENS unit helping phantom pain--he probably wants to check with the surgeon at [facility] about that. May need to see a pain specialist---narcotics are not the answer here (or will need pain specialist to manage if ongoing narcotic need)

## 2012-01-22 NOTE — Telephone Encounter (Signed)
Spoke with patient and advised results, pt did not need refill, pt will call if this not helping.

## 2012-01-22 NOTE — Telephone Encounter (Signed)
Caller: Abriel/Patient; Patient Name: Dylan Mckenzie; PCP: Tillman Abide [facility]); Best Callback Phone Number: [phone]; pt is calling and states that he is having a lot of pain to his rt amputated foot; states that he can not get a refill on pain med until 01/28/12; offered appt but pt refusing; states that MD has come to the home to see him in the past; pt would like to see if a TENS unit will help him; pain is unbearable at night when he lays down; cries himself to sleep; Gabapentin with Tylenol is not helping; also wanting to see if something else can be called in for him until he can get the Oxycodone 5mg  2 tablets every 3 hours filled; rates pain 5/10 at present time; at night it is a 10/10; denies any problems with the stump; pt feels this is phantom pain; Triaged per Foot Non-Injury Guideline; See in 24 hours due to new onset moderate pain that has not improved with 24 hours of homecare;  OFFICE PLEASE REVIEW AND CALL PT BACK REGARDING POSSIBLE ORDER FOR TENS UNIT OR ADDITIONAL MEDS TO CONTROL PAIN IN AMPUTATED FOOT TO HELP UNTIL OXYCODONE CAN BE FILLED ON 01/28/12; PT REFUSING APPT STATING THAT MD IS AWARE OF THIS PHANTOM PAIN;  pt instructed that he will receive a call back but for him to call back if sx worsen or change; will comply

## 2012-01-24 ENCOUNTER — Telehealth: Payer: Self-pay | Admitting: Internal Medicine

## 2012-01-24 MED ORDER — TRAMADOL HCL 50 MG PO TABS: 50.0000 mg | ORAL_TABLET | Freq: Three times a day (TID) | ORAL | Status: DC | PRN

## 2012-01-24 NOTE — Telephone Encounter (Signed)
rx sent to pharmacy by e-script Spoke with patient and advised results   

## 2012-01-24 NOTE — Telephone Encounter (Signed)
Okay to send order for tramadol 50mg  1 tid prn #90 x 0 While dependence can be a problem with tramadol also, it is much safer than the oxycodone I would not recommend any separate sleep med if pain is keeping him up  He can try OTC melatonin 5mg  (or even up to 10mg ) if he would like This is pretty safe

## 2012-01-24 NOTE — Telephone Encounter (Signed)
Caller: [name]/Spouse; Patient Name: Dylan Mckenzie; PCP: Tillman Abide [facility]); Best Callback Phone Number: [phone].  See previous triage note from 01/22/12 at 10:21 AM.  Pt calling back because the Gabapentin is not helping his pain. Said his home health nurse came by and suggested that Tramadol may help.  Pt wanting to know if MD will call in Tramadol, since it is a non-narcotic.  Said the previous advise MD gave regarding increasing the Gabapentin in not helping at all.  Would also like to know if MD will call in something to help him get some sleep.  Says not able to sleep through the night because of the pain.  [phone]  PLEASE CALL PT BACK AT [phone] TO LET HIM KNOW IF MD AGREES TO CALL IN TRAMADOL AND SOMETHING FOR SLEEP.

## 2012-01-30 ENCOUNTER — Other Ambulatory Visit: Payer: Self-pay | Admitting: *Deleted

## 2012-01-30 MED ORDER — OXYCODONE HCL 5 MG PO CAPS: 5.0000 mg | ORAL_CAPSULE | ORAL | Status: DC | PRN

## 2012-01-30 MED ORDER — LIDOCAINE 5 % EX PTCH: 1.0000 | MEDICATED_PATCH | CUTANEOUS | Status: DC

## 2012-01-30 NOTE — Telephone Encounter (Signed)
Spoke with patient and advised rx ready for pick-up and it will be at the front desk.  

## 2012-01-30 NOTE — Telephone Encounter (Signed)
Please call when ready.

## 2012-02-02 ENCOUNTER — Telehealth: Payer: Self-pay | Admitting: Internal Medicine
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
## 2012-02-03 MED ORDER — GABAPENTIN 300 MG PO CAPS: 900.0000 mg | ORAL_CAPSULE | Freq: Every day | ORAL | Status: DC

## 2012-02-03 NOTE — Telephone Encounter (Signed)
Spoke with patient Dylan Mckenzie refill the gabapentin He does plan to come in on Monday

## 2012-02-05 ENCOUNTER — Encounter: Payer: Self-pay | Admitting: Internal Medicine

## 2012-02-05 ENCOUNTER — Ambulatory Visit (INDEPENDENT_AMBULATORY_CARE_PROVIDER_SITE_OTHER): Payer: PRIVATE HEALTH INSURANCE | Admitting: Internal Medicine

## 2012-02-05 VITALS — BP 112/62 | HR 88 | Temp 98.2°F | Ht 69.0 in | Wt 138.0 lb

## 2012-02-05 DIAGNOSIS — F329 Major depressive disorder, single episode, unspecified: Secondary | ICD-10-CM

## 2012-02-05 MED ORDER — MIRTAZAPINE 15 MG PO TABS: 15.0000 mg | ORAL_TABLET | Freq: Every day | ORAL | Status: DC
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
## 2012-02-05 NOTE — Assessment & Plan Note (Signed)
Mostly reactive But has had some degree of mood problems before this Not sleeping Appetite is poor  Discussed options Will go ahead and try mirtazapine

## 2012-02-08 ENCOUNTER — Telehealth: Payer: Self-pay | Admitting: Internal Medicine

## 2012-02-08 NOTE — Telephone Encounter (Signed)
Please offer an appt for tomorrow

## 2012-02-08 NOTE — Telephone Encounter (Signed)
Patient Information:  Caller Name: Ismaeel  Phone: [phone]  Patient: Dylan Mckenzie, Dylan Mckenzie  Gender: Male  DOB: [birth year]  Age: 51 Years  PCP: Tillman Abide [facility])   Symptoms  Reason For Call & Symptoms: chest pain, fever 100.3 O  Reviewed Health History In EMR: Yes  Reviewed Medications In EMR: Yes  Reviewed Allergies In EMR: Yes  Date of Onset of Symptoms: 02/08/2012  Any Fever: Yes  Fever Taken: Oral  Fever Time Of Reading: 15:00:00  Fever Last Reading: 103.3  Guideline(s) Used:  Chest Pain  Disposition Per Guideline:   See Today in Office  Reason For Disposition Reached:   Fever > 100.5 F (38.1 C)  Advice Given:  N/A  Office Follow Up:  Does the office need to follow up with this patient?: Yes  Instructions For The Office: Appt advised 02/08/12; no appts available in Epic.  Info to office for staff review/appt management/callback.

## 2012-02-08 NOTE — Telephone Encounter (Signed)
Spoke with patient and his fever is gone and the chest pain is going away, per pt he will call for appt tomorrow if anything changes.

## 2012-02-29 ENCOUNTER — Other Ambulatory Visit: Payer: Self-pay | Admitting: Internal Medicine

## 2012-02-29 NOTE — Telephone Encounter (Signed)
Lady's voice ( no name given) left v/m requesting refill Tramadol to [facility]. Call back to [phone].Please advise.

## 2012-02-29 NOTE — Telephone Encounter (Signed)
Okay #90 x 0 

## 2012-02-29 NOTE — Telephone Encounter (Signed)
rx sent to pharmacy by e-script  

## 2012-03-05 ENCOUNTER — Ambulatory Visit: Payer: PRIVATE HEALTH INSURANCE | Admitting: Internal Medicine

## 2012-04-01 ENCOUNTER — Other Ambulatory Visit: Payer: Self-pay

## 2012-04-01 MED ORDER — OXYCODONE HCL 5 MG PO CAPS: 5.0000 mg | ORAL_CAPSULE | Freq: Three times a day (TID) | ORAL | Status: DC | PRN
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
## 2012-04-01 NOTE — Telephone Encounter (Signed)
Dylan Mckenzie request rx oxycodone. Call when ready for pick up.

## 2012-04-12 ENCOUNTER — Other Ambulatory Visit: Payer: Self-pay | Admitting: Internal Medicine

## 2012-04-12 NOTE — Telephone Encounter (Signed)
Okay #90 x 0 This is the last Rx for any pain medication I will do till he is back in for a recheck

## 2012-04-12 NOTE — Telephone Encounter (Signed)
Refill called [facility] in [place].

## 2012-04-18 ENCOUNTER — Other Ambulatory Visit: Payer: Self-pay | Admitting: Internal Medicine

## 2012-04-19 NOTE — Telephone Encounter (Signed)
Refill denied, patient scheduled appt for 04/22/12

## 2012-04-19 NOTE — Telephone Encounter (Signed)
I said with the last refill of hydrocodone that I would not give any more meds till he came in for appt Still has nothing scheduled so no refill for now

## 2012-04-22 ENCOUNTER — Ambulatory Visit: Payer: PRIVATE HEALTH INSURANCE | Admitting: Internal Medicine

## 2012-06-17 ENCOUNTER — Encounter: Payer: Self-pay | Admitting: Internal Medicine

## 2012-06-17 ENCOUNTER — Ambulatory Visit (INDEPENDENT_AMBULATORY_CARE_PROVIDER_SITE_OTHER): Payer: Medicaid Other | Admitting: Internal Medicine

## 2012-06-17 ENCOUNTER — Ambulatory Visit (INDEPENDENT_AMBULATORY_CARE_PROVIDER_SITE_OTHER)
Admission: RE | Admit: 2012-06-17 | Discharge: 2012-06-17 | Disposition: A | Discharge status: Home / Self Care | Payer: Medicaid Other | Source: Ambulatory Visit | Attending: Internal Medicine | Admitting: Internal Medicine

## 2012-06-17 VITALS — BP 128/80 | HR 133 | Temp 102.1°F | Resp 32 | Wt 147.0 lb

## 2012-06-17 DIAGNOSIS — R0781 Pleurodynia: Secondary | ICD-10-CM

## 2012-06-17 DIAGNOSIS — R071 Chest pain on breathing: Secondary | ICD-10-CM

## 2012-06-17 MED ORDER — LEVOFLOXACIN 500 MG PO TABS: 500.0000 mg | ORAL_TABLET | Freq: Every day | ORAL | Status: DC
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
## 2012-06-17 NOTE — Assessment & Plan Note (Signed)
Seems to be chest wall related Doesn't explain his fever Tachycardic CXR shows small effusions but no clear pneumonia---awaiting radiologist Will try levaquin just in case Pain meds for the pain to decrease tachypnea and possible hypoventilation

## 2012-06-27 ENCOUNTER — Encounter: Payer: Self-pay | Admitting: Orthopedic Surgery

## 2012-07-25 ENCOUNTER — Encounter: Payer: Self-pay | Admitting: Orthopedic Surgery

## 2012-08-09 ENCOUNTER — Telehealth: Payer: Self-pay | Admitting: Internal Medicine

## 2012-08-09 MED ORDER — TRAMADOL HCL 50 MG PO TABS: ORAL_TABLET | ORAL | Status: DC

## 2012-08-09 NOTE — Telephone Encounter (Signed)
Patient Information:  Caller Name: Dylan Mckenzie  Phone: [phone]  Patient: Dylan, Mckenzie  Gender: Male  DOB: 1960/10/07  Age: 52 Years  PCP: Tillman Abide [facility])  Office Follow Up:  Does the office need to follow up with this patient?: Yes  Instructions For The Office: Requesting different antidepressant that can be given during the day and a refill on Tramadol and Oxycodone.   Symptoms  Reason For Call & Symptoms: Started on for Mirtazipine 1.5 mgs 1 PO at HS in November 2013 for depression . He is still having sx of depression when fiance has to go to work and is missing doses of medication since she is not there to make sure he takes it. Wondering if another medication can be tried that can give during the day. Needing refill of Tramadol 50 mgs 1 PO TID and Oxycodone 5 mgs 1 PO TID. Please call when meds called in to Pharmacy and Rx ready for pick.  Reviewed Health History In EMR: Yes  Reviewed Medications In EMR: Yes  Reviewed Allergies In EMR: Yes  Reviewed Surgeries / Procedures: Yes  Date of Onset of Symptoms: 08/07/2012  Guideline(s) Used:  No Protocol Available - Information Only  Disposition Per Guideline:   Discuss with PCP and Callback by Nurse Today  Reason For Disposition Reached:   Nursing judgment  Advice Given:  Call Back If:  New symptoms develop  You become worse.  Patient Will Follow Care Advice:  YES

## 2012-08-09 NOTE — Telephone Encounter (Signed)
Okay to refill tramadol #90 x 0 I would like to discuss the oxycodone with him before refilling because it can cause depression at times  I also want to discuss the depression with him before I change meds I need to better assess what is going on now before I can be sure what med would be best

## 2012-08-09 NOTE — Telephone Encounter (Signed)
Patient advised and medication sent in pharmacy

## 2012-08-12 ENCOUNTER — Ambulatory Visit: Payer: Medicaid Other | Admitting: Internal Medicine

## 2012-08-25 ENCOUNTER — Encounter: Payer: Self-pay | Admitting: Orthopedic Surgery

## 2012-11-22 ENCOUNTER — Emergency Department: Payer: Self-pay | Admitting: Emergency Medicine

## 2013-09-08 ENCOUNTER — Ambulatory Visit (INDEPENDENT_AMBULATORY_CARE_PROVIDER_SITE_OTHER): Payer: Medicaid Other | Admitting: Family Medicine

## 2013-09-08 ENCOUNTER — Encounter: Payer: Self-pay | Admitting: Family Medicine

## 2013-09-08 VITALS — BP 108/64 | HR 64 | Temp 98.0°F | Resp 18

## 2013-09-08 DIAGNOSIS — J069 Acute upper respiratory infection, unspecified: Secondary | ICD-10-CM

## 2013-09-08 DIAGNOSIS — H6692 Otitis media, unspecified, left ear: Secondary | ICD-10-CM

## 2013-09-08 DIAGNOSIS — H6690 Otitis media, unspecified, unspecified ear: Secondary | ICD-10-CM
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
## 2013-09-09 ENCOUNTER — Telehealth: Payer: Self-pay | Admitting: Internal Medicine

## 2013-09-09 NOTE — Telephone Encounter (Signed)
Relevant patient education mailed to patient.  

## 2013-12-25 ENCOUNTER — Ambulatory Visit: Payer: Medicaid Other | Admitting: Internal Medicine

## 2014-04-03 ENCOUNTER — Telehealth: Payer: Self-pay | Admitting: *Deleted

## 2014-04-03 NOTE — Telephone Encounter (Signed)
I returned call to [name]. Pt c/o a spot on his left shoulder that won't heal. She scheduled him an appt on 04/08/14, and was sent to [facility] to see if he needed to be seen sooner than that. She thinks the spot is where a mole had come off. It scabs over, but wound must be reopening when he changes clothes, or takes a shower. Per Dois DavenportSandra he has not run a fever, the area is not warm to touch, and only has a small amount of redness around it when scab is irritated/ pulled off.   I advised that it should be fine to wait until his scheduled appt on 04/08/14. She said that he was fine with that since the area doesn't really bother him. I told her that I would forward the message to a Dr just to make sure he didn't need to be seen sooner, and that we would call if he did. I advised her to have him keep the area clean and dry, and to call the office ( can speak with on call Dr if after hours) or go to [facility] if he starts to run a fever, area becomes red and inflamed, or becomes warm to the touch.

## 2014-04-03 NOTE — Telephone Encounter (Signed)
[facility] - Client TELEPHONE ADVICE RECORD [facility] Patient Name: Marnee GuarneriRANDY Willenbring Gender: Male DOB: 03/14/1961 Age: [age] Y 7 M 7 D Return Phone Number: [phone] (Primary) Address: City/State/Zip: [place] Client [facility] [place] [facility] Day Physician Tillman AbideLetvak, Richard Contact Type Call Caller Name Clydene LamingSandra Caller Phone Number [phone] Relationship To Patient Friend Is this call to report lab results? No Call Type General Information Initial Comment Caller states he needs the office to call him. General Information Type Message Only Nurse Assessment Guidelines Guideline Title Affirmed Question Affirmed Notes Nurse Date/Time (Eastern Time) Disp. Time Lamount Cohen(Eastern Time) Disposition Final User 04/03/2014 12:32:32 PM General Information Provided Yes Donnelly AngelicaHowe, Kim After Care Instructions Given Call Event Type User Date / Time Description

## 2014-04-03 NOTE — Telephone Encounter (Signed)
If no signs of infection I think that sounds ok  Will forward to PCP also

## 2014-04-04 NOTE — Telephone Encounter (Signed)
It does sound like it is safe to wait

## 2014-04-08 ENCOUNTER — Encounter: Payer: Self-pay | Admitting: Internal Medicine

## 2014-04-08 ENCOUNTER — Ambulatory Visit (INDEPENDENT_AMBULATORY_CARE_PROVIDER_SITE_OTHER): Payer: Medicaid Other | Admitting: Internal Medicine

## 2014-04-08 VITALS — BP 118/64 | HR 60 | Temp 97.2°F | Ht 69.0 in | Wt 160.0 lb

## 2014-04-08 DIAGNOSIS — L989 Disorder of the skin and subcutaneous tissue, unspecified: Secondary | ICD-10-CM | POA: Insufficient documentation

## 2014-04-08 DIAGNOSIS — Z89511 Acquired absence of right leg below knee: Secondary | ICD-10-CM

## 2014-04-08 MED ORDER — OXYCODONE HCL 5 MG PO CAPS: 5.0000 mg | ORAL_CAPSULE | Freq: Three times a day (TID) | ORAL | Status: DC | PRN

## 2014-04-08 NOTE — Assessment & Plan Note (Signed)
Suspicious and non healing Will set up with derm

## 2014-04-08 NOTE — Assessment & Plan Note (Signed)
Still having problems with fit Uses tramadol at times but requests small amount of oxycodone pending new prosthesis Only 3 Rx for tramadol on state registry from Dr Edwena Blowstrum (orthopedist)

## 2014-04-08 NOTE — Progress Notes (Signed)
Pre visit review using our clinic review tool, if applicable. No additional management support is needed unless otherwise documented below in the visit note. 
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
## 2015-06-01 DIAGNOSIS — S88911D Complete traumatic amputation of right lower leg, level unspecified, subsequent encounter: Secondary | ICD-10-CM | POA: Diagnosis not present

## 2015-08-18 DIAGNOSIS — R5383 Other fatigue: Secondary | ICD-10-CM | POA: Diagnosis not present

## 2015-08-18 DIAGNOSIS — E7800 Pure hypercholesterolemia, unspecified: Secondary | ICD-10-CM | POA: Diagnosis not present

## 2015-08-18 DIAGNOSIS — N401 Enlarged prostate with lower urinary tract symptoms: Secondary | ICD-10-CM | POA: Diagnosis not present

## 2015-08-18 DIAGNOSIS — S88911D Complete traumatic amputation of right lower leg, level unspecified, subsequent encounter: Secondary | ICD-10-CM | POA: Diagnosis not present

## 2015-08-18 DIAGNOSIS — N400 Enlarged prostate without lower urinary tract symptoms: Secondary | ICD-10-CM | POA: Diagnosis not present

## 2015-08-18 DIAGNOSIS — M25561 Pain in right knee: Secondary | ICD-10-CM | POA: Diagnosis not present

## 2015-08-18 DIAGNOSIS — E559 Vitamin D deficiency, unspecified: Secondary | ICD-10-CM | POA: Diagnosis not present

## 2015-08-19 DIAGNOSIS — Z79891 Long term (current) use of opiate analgesic: Secondary | ICD-10-CM | POA: Diagnosis not present

## 2016-02-02 DIAGNOSIS — Z0000 Encounter for general adult medical examination without abnormal findings: Secondary | ICD-10-CM | POA: Diagnosis not present

## 2016-02-02 DIAGNOSIS — F17200 Nicotine dependence, unspecified, uncomplicated: Secondary | ICD-10-CM | POA: Diagnosis not present

## 2016-02-02 DIAGNOSIS — Z1389 Encounter for screening for other disorder: Secondary | ICD-10-CM | POA: Diagnosis not present

## 2016-02-02 DIAGNOSIS — M25561 Pain in right knee: Secondary | ICD-10-CM | POA: Diagnosis not present

## 2016-02-02 DIAGNOSIS — Z79899 Other long term (current) drug therapy: Secondary | ICD-10-CM | POA: Diagnosis not present

## 2016-02-02 DIAGNOSIS — E781 Pure hyperglyceridemia: Secondary | ICD-10-CM | POA: Diagnosis not present

## 2016-02-02 DIAGNOSIS — R079 Chest pain, unspecified: Secondary | ICD-10-CM | POA: Diagnosis not present

## 2016-02-02 DIAGNOSIS — Z716 Tobacco abuse counseling: Secondary | ICD-10-CM | POA: Diagnosis not present

## 2016-04-03 DIAGNOSIS — Z79899 Other long term (current) drug therapy: Secondary | ICD-10-CM | POA: Diagnosis not present

## 2016-04-03 DIAGNOSIS — F17200 Nicotine dependence, unspecified, uncomplicated: Secondary | ICD-10-CM | POA: Diagnosis not present

## 2016-04-03 DIAGNOSIS — E781 Pure hyperglyceridemia: Secondary | ICD-10-CM | POA: Diagnosis not present

## 2016-04-03 DIAGNOSIS — E079 Disorder of thyroid, unspecified: Secondary | ICD-10-CM | POA: Diagnosis not present

## 2016-04-03 DIAGNOSIS — Z716 Tobacco abuse counseling: Secondary | ICD-10-CM | POA: Diagnosis not present

## 2016-04-03 DIAGNOSIS — J449 Chronic obstructive pulmonary disease, unspecified: Secondary | ICD-10-CM | POA: Diagnosis not present

## 2016-04-03 DIAGNOSIS — E7800 Pure hypercholesterolemia, unspecified: Secondary | ICD-10-CM | POA: Diagnosis not present

## 2016-04-03 DIAGNOSIS — E559 Vitamin D deficiency, unspecified: Secondary | ICD-10-CM | POA: Diagnosis not present

## 2016-04-03 DIAGNOSIS — F17210 Nicotine dependence, cigarettes, uncomplicated: Secondary | ICD-10-CM | POA: Diagnosis not present

## 2016-04-03 DIAGNOSIS — Z79891 Long term (current) use of opiate analgesic: Secondary | ICD-10-CM | POA: Diagnosis not present

## 2016-04-03 DIAGNOSIS — R5383 Other fatigue: Secondary | ICD-10-CM | POA: Diagnosis not present

## 2016-09-22 ENCOUNTER — Encounter: Payer: Self-pay | Admitting: Internal Medicine

## 2016-09-22 ENCOUNTER — Ambulatory Visit (INDEPENDENT_AMBULATORY_CARE_PROVIDER_SITE_OTHER): Payer: Medicare Other | Admitting: Internal Medicine

## 2016-09-22 VITALS — BP 114/78 | HR 71 | Temp 97.8°F | Wt 149.0 lb

## 2016-09-22 DIAGNOSIS — T8789 Other complications of amputation stump: Secondary | ICD-10-CM | POA: Insufficient documentation

## 2016-09-22 DIAGNOSIS — M79609 Pain in unspecified limb: Secondary | ICD-10-CM

## 2016-09-22 DIAGNOSIS — G8918 Other acute postprocedural pain: Secondary | ICD-10-CM
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
## 2016-09-22 NOTE — Assessment & Plan Note (Addendum)
May have some change in bone from excessive strain Has horizontal bone at bottom of tibia and fibula at amputation site---so probably needs x-ray and [facility] (since [facility]) His stump is fairly small now--much smaller than in past I suspect he needs a new prosthesis altogether--but at least needs new liner, socks and femur attachment He will discuss this with the prosthetist  He asks for pain meds Reviewed CSRS--he got #60 tramadol with 2 refills 6/11 Told him I can't give him other meds Asks for oxycodone--- I can't give this

## 2016-10-23 ENCOUNTER — Other Ambulatory Visit: Payer: Self-pay | Admitting: Internal Medicine

## 2016-11-20 ENCOUNTER — Other Ambulatory Visit: Payer: Self-pay

## 2016-11-20 MED ORDER — TRAMADOL HCL 50 MG PO TABS: ORAL_TABLET | ORAL | 0 refills | Status: DC

## 2016-11-20 NOTE — Telephone Encounter (Signed)
He got 3 months from Dr Clint Guy in June---I can't refill this till next month. I am willing to do it at the beginning of the month--so he should check back next week

## 2016-11-20 NOTE — Telephone Encounter (Signed)
Pt left v/m requesting refill tramadol; on  09/22/16 office note has refilled # 60 x 2  On 09/04/16.Please advise.

## 2016-11-20 NOTE — Telephone Encounter (Signed)
[facility]ist. The last rx was done 04-13-16 #60/1. Last filled 09-04-16. I gave her the verbal refill

## 2016-11-20 NOTE — Telephone Encounter (Signed)
I checked the state registry and that prescription had 2 refills Since we are very close, okay to fill #60 x 0

## 2016-11-20 NOTE — Telephone Encounter (Signed)
Spoke to pt. He said he received #60 on 08-25-16 and has 1 left.

## 2016-12-26 ENCOUNTER — Encounter: Payer: Self-pay | Admitting: Internal Medicine

## 2016-12-26 ENCOUNTER — Ambulatory Visit (INDEPENDENT_AMBULATORY_CARE_PROVIDER_SITE_OTHER): Payer: Medicare Other | Admitting: Internal Medicine

## 2016-12-26 ENCOUNTER — Encounter (INDEPENDENT_AMBULATORY_CARE_PROVIDER_SITE_OTHER): Payer: Self-pay

## 2016-12-26 VITALS — BP 126/64 | HR 62 | Temp 97.6°F | Ht 69.0 in | Wt 147.0 lb

## 2016-12-26 DIAGNOSIS — Z720 Tobacco use: Secondary | ICD-10-CM | POA: Diagnosis not present

## 2016-12-26 DIAGNOSIS — Z23 Encounter for immunization: Secondary | ICD-10-CM | POA: Diagnosis not present

## 2016-12-26 DIAGNOSIS — M79609 Pain in unspecified limb: Secondary | ICD-10-CM

## 2016-12-26 DIAGNOSIS — H547 Unspecified visual loss: Secondary | ICD-10-CM | POA: Diagnosis not present

## 2016-12-26 DIAGNOSIS — T8789 Other complications of amputation stump: Secondary | ICD-10-CM | POA: Diagnosis not present

## 2016-12-26 DIAGNOSIS — G546 Phantom limb syndrome with pain: Secondary | ICD-10-CM | POA: Diagnosis not present

## 2016-12-26 NOTE — Addendum Note (Signed)
Addended by: Milas Kocher A on: 12/26/2016 02:10 PM   Modules accepted: Orders

## 2016-12-26 NOTE — Assessment & Plan Note (Signed)
He will set up with eye doctor

## 2016-12-26 NOTE — Assessment & Plan Note (Signed)
This is rare now--mostly after working all day Uses the gabapentin--but trying to limit this
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
## 2016-12-26 NOTE — Assessment & Plan Note (Signed)
Counseled on stopping

## 2016-12-26 NOTE — Assessment & Plan Note (Signed)
Improved with new liner for prosthesis Takes 1 tramadol daily at most

## 2017-02-02 ENCOUNTER — Other Ambulatory Visit: Payer: Self-pay | Admitting: Internal Medicine

## 2017-02-02 NOTE — Telephone Encounter (Signed)
Left refill on voice mail at pharmacy  

## 2017-02-02 NOTE — Telephone Encounter (Signed)
Approved: #60 x 0 

## 2017-02-02 NOTE — Telephone Encounter (Signed)
Last filled 11-20-16 #60 Last OV 12-26-16 Next OV 07-16-17

## 2017-04-07 ENCOUNTER — Other Ambulatory Visit: Payer: Self-pay | Admitting: Internal Medicine

## 2017-04-09 NOTE — Telephone Encounter (Signed)
Last filled 02-02-17 #60 Last OV 12-26-16 Next OV 07-16-17  (I put it under normal in case it can be sent electronically)

## 2017-04-15 ENCOUNTER — Other Ambulatory Visit: Payer: Self-pay | Admitting: Internal Medicine

## 2017-06-15 ENCOUNTER — Ambulatory Visit: Payer: Self-pay

## 2017-06-15 NOTE — Telephone Encounter (Signed)
Pt. called to report nausea, vomiting, diarrhea, weakness, lack of energy, and intense cramping in left calf.  Reported "it hit me like a ton of bricks at work on [employer] after drinking about 1/2 cup coffee."  Pt. questioned if he got sick from the coffee that he got out of a machine at work.  Stated diarrhea and vomiting lasted Tues. night into Wed. morning.  Reported extreme intermittent cramping in left calf and arms also. (has right BKA)  Today, c/o muscle aching, chills, weakness, low energy, and residual abdominal soreness.  Reported has not checked temperature.  Able to urinate in good amt.; urine dark yellow. C/o dry mouth.  Stated has not had any appetite.  Denied any further vomiting or diarrhea since Wednesday.  Advised of need to maintain hydration.  Pt. Stated he feels a little better compared to the last few days.  Care advice given per protocol.  Appt. Sched. With PCP on 3/25.  Encouraged to call back or go to UC if symptoms worsen.  Verb. Understanding; agrees with plan.      Reason for Disposition . [1] MILD vomiting with diarrhea AND [2] present > 5 days    Reported vomiting diarrhea Tues and Wed.; now subsided; c/o soreness of abd. muscles from vomiting.  Answer Assessment - Initial Assessment Questions 1. VOMITING SEVERITY: "How many times have you vomited in the past 24 hours?"     - MILD:  1 - 2 times/day    - MODERATE: 3 - 5 times/day, decreased oral intake without significant weight loss or symptoms of dehydration    - SEVERE: 6 or more times/day, vomits everything or nearly everything, with significant weight loss, symptoms of dehydration      Was severe Tues and Wed.  2. ONSET: "When did the vomiting begin?"      Tuesday 3. FLUIDS: "What fluids or food have you vomited up today?" "Have you been able to keep any fluids down?"     Taking Sprite and Gatorade, chicken broth 4. ABDOMINAL PAIN: "Are your having any abdominal pain?" If yes : "How bad is it and what does it feel  like?" (e.g., crampy, dull, intermittent, constant)      Soreness from vomiting and diarrhea 5. DIARRHEA: "Is there any diarrhea?" If so, ask: "How many times today?"      Tues and Wed; subsided 6. CONTACTS: "Is there anyone else in the family with the same symptoms?"      Not aware of 7. CAUSE: "What do you think is causing your vomiting?"     unknown 8. HYDRATION STATUS: "Any signs of dehydration?" (e.g., dry mouth [not only dry lips], too weak to stand) "When did you last urinate?"     Dry mouth, weakness 9. OTHER SYMPTOMS: "Do you have any other symptoms?" (e.g., fever, headache, vertigo, vomiting blood or coffee grounds, recent head injury)     Weakness, no appetite, chills, clammy at times  10. PREGNANCY: "Is there any chance you are pregnant?" "When was your last menstrual period?"       n/a  Protocols used: [facility] Division[facility]OM[facility]ING-A-AH

## 2017-06-18 ENCOUNTER — Encounter: Payer: Self-pay | Admitting: Internal Medicine

## 2017-06-18 ENCOUNTER — Ambulatory Visit (INDEPENDENT_AMBULATORY_CARE_PROVIDER_SITE_OTHER): Payer: Medicare Other | Admitting: Internal Medicine

## 2017-06-18 VITALS — BP 106/90 | HR 66 | Temp 97.7°F | Ht 69.0 in | Wt 149.0 lb

## 2017-06-18 DIAGNOSIS — K529 Noninfective gastroenteritis and colitis, unspecified: Secondary | ICD-10-CM

## 2017-06-18 MED ORDER — TRAMADOL HCL 50 MG PO TABS: 50.0000 mg | ORAL_TABLET | Freq: Three times a day (TID) | ORAL | 0 refills | Status: DC | PRN

## 2017-06-18 NOTE — Assessment & Plan Note (Signed)
Vs food poisoning Mild residual soreness but eating and moving bowels fine No dehydration Reassured--should resolve over time
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
## 2017-07-16 ENCOUNTER — Encounter: Payer: Medicare Other | Admitting: Internal Medicine

## 2017-08-17 ENCOUNTER — Other Ambulatory Visit: Payer: Self-pay | Admitting: Internal Medicine

## 2017-08-21 NOTE — Telephone Encounter (Signed)
Last filled 06-18-17 #60 Last OV 06-18-17 acute No Show 07-16-17 No Future OV

## 2017-10-11 ENCOUNTER — Other Ambulatory Visit: Payer: Self-pay | Admitting: Internal Medicine

## 2017-10-26 ENCOUNTER — Other Ambulatory Visit: Payer: Self-pay | Admitting: Internal Medicine

## 2017-10-26 NOTE — Telephone Encounter (Signed)
Last filled 08-21-17 #60 Last OV Acute 06-18-17 No Future OV

## 2017-10-29 ENCOUNTER — Other Ambulatory Visit: Payer: Self-pay | Admitting: Internal Medicine

## 2017-10-29 NOTE — Telephone Encounter (Signed)
Last filled 08-21-17 #60 Last OV 06-18-17 Acute No Future OV

## 2017-12-27 ENCOUNTER — Telehealth: Payer: Self-pay | Admitting: Internal Medicine

## 2017-12-28 MED ORDER — GABAPENTIN 300 MG PO CAPS: 300.0000 mg | ORAL_CAPSULE | Freq: Every day | ORAL | 1 refills | Status: DC

## 2017-12-28 NOTE — Telephone Encounter (Signed)
Have him set up a physical in the next 3-6 months

## 2017-12-28 NOTE — Telephone Encounter (Signed)
Rx sent electronically.  

## 2017-12-28 NOTE — Telephone Encounter (Signed)
Last filled 10-26-17 #60 Last OV 06-18-17 Acute No Future OV

## 2017-12-28 NOTE — Telephone Encounter (Signed)
Pt called back to request a refill of  gabapentin (NEURONTIN) 300 MG capsule  (pt states he has enough for about 12 more days) [facility] [address] [place] [facility] [address] [phone] (Phone) [phone] (Fax)

## 2017-12-28 NOTE — Telephone Encounter (Signed)
Patient scheduled appointment for a cpx in February.

## 2018-04-29 ENCOUNTER — Other Ambulatory Visit: Payer: Self-pay | Admitting: Internal Medicine

## 2018-04-29 NOTE — Telephone Encounter (Signed)
Last filled 02-26-18 #60 Last OV 12-26-16 Next [facility] 05-09-18 [facility]  Forward to [facility] in Dr Karle Starch absence

## 2018-05-09 ENCOUNTER — Ambulatory Visit (INDEPENDENT_AMBULATORY_CARE_PROVIDER_SITE_OTHER): Payer: Medicare Other | Admitting: Internal Medicine

## 2018-05-09 ENCOUNTER — Encounter: Payer: Self-pay | Admitting: Internal Medicine

## 2018-05-09 VITALS — BP 132/80 | HR 62 | Temp 98.1°F | Ht 69.0 in | Wt 149.0 lb

## 2018-05-09 DIAGNOSIS — G546 Phantom limb syndrome with pain: Secondary | ICD-10-CM

## 2018-05-09 DIAGNOSIS — I10 Essential (primary) hypertension: Secondary | ICD-10-CM | POA: Diagnosis not present

## 2018-05-09 DIAGNOSIS — Z89511 Acquired absence of right leg below knee: Secondary | ICD-10-CM | POA: Diagnosis not present

## 2018-05-09 DIAGNOSIS — Z0000 Encounter for general adult medical examination without abnormal findings: Secondary | ICD-10-CM | POA: Diagnosis not present

## 2018-05-09 DIAGNOSIS — Z7189 Other specified counseling: Secondary | ICD-10-CM | POA: Insufficient documentation

## 2018-05-09 DIAGNOSIS — Z1211 Encounter for screening for malignant neoplasm of colon: Secondary | ICD-10-CM

## 2018-05-09 DIAGNOSIS — Z125 Encounter for screening for malignant neoplasm of prostate: Secondary | ICD-10-CM

## 2018-05-09 DIAGNOSIS — Z720 Tobacco use: Secondary | ICD-10-CM | POA: Diagnosis not present
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
## 2018-05-09 NOTE — Assessment & Plan Note (Signed)
Uses the gabapentin nightly and the tramadol once a day usually

## 2018-05-09 NOTE — Assessment & Plan Note (Signed)
I have personally reviewed the Medicare Annual Wellness questionnaire and have noted 1. The patient's medical and social history 2. Their use of alcohol, tobacco or illicit drugs 3. Their current medications and supplements 4. The patient's functional ability including ADL's, fall risks, home safety risks and hearing or visual             impairment. 5. Diet and physical activities 6. Evidence for depression or mood disorders  The patients weight, height, BMI and visual acuity have been recorded in the chart I have made referrals, counseling and provided education to the patient based review of the above and I have provided the pt with a written personalized care plan for preventive services.  I have provided you with a copy of your personalized plan for preventive services. Please take the time to review along with your updated medication list.  Counseled on exercise, stopping smoking,etc Prefers to do FIT Discussed PSA---will check Yearly flu vaccine

## 2018-05-09 NOTE — Assessment & Plan Note (Signed)
BP Readings from Last 3 Encounters:  05/09/18 132/80  06/18/17 106/90  12/26/16 126/64   Has been better lately--no Rx

## 2018-05-09 NOTE — Assessment & Plan Note (Signed)
See social history Blank forms given 

## 2018-05-09 NOTE — Assessment & Plan Note (Signed)
Discussed referral for lung cancer screening--he wants to proceed

## 2018-05-09 NOTE — Assessment & Plan Note (Signed)
Rx given for parts for prosthesis

## 2018-05-09 NOTE — Progress Notes (Signed)
Visual Acuity Screening   Right eye Left eye Both eyes  Without correction: 20/50 20/30 20/20   With correction:     Hearing Screening Comments: Hearing test at work 3 weeks ago. Tested regularly.

## 2018-05-10 LAB — COMPREHENSIVE METABOLIC PANEL
ALT: 8 U/L (ref 0–53)
AST: 13 U/L (ref 0–37)
Albumin: 4.4 g/dL (ref 3.5–5.2)
Alkaline Phosphatase: 66 U/L (ref 39–117)
BUN: 18 mg/dL (ref 6–23)
CO2: 26 meq/L (ref 19–32)
Calcium: 9.1 mg/dL (ref 8.4–10.5)
Chloride: 103 mEq/L (ref 96–112)
Creatinine, Ser: 1.12 mg/dL (ref 0.40–1.50)
GFR: 67.89 mL/min (ref >60.00)
GLUCOSE: 81 mg/dL (ref 70–99)
POTASSIUM: 4.1 meq/L (ref 3.5–5.1)
SODIUM: 138 meq/L (ref 135–145)
TOTAL PROTEIN: 6.8 g/dL (ref 6.0–8.3)
Total Bilirubin: 0.5 mg/dL (ref 0.2–1.2)

## 2018-05-10 LAB — CBC
HEMATOCRIT: 48.5 % (ref 39.0–52.0)
Hemoglobin: 16.4 g/dL (ref 13.0–17.0)
MCHC: 33.8 g/dL (ref 30.0–36.0)
MCV: 94.1 fl (ref 78.0–100.0)
Platelets: 269 10*3/uL (ref 150.0–400.0)
RBC: 5.16 Mil/uL (ref 4.22–5.81)
RDW: 13.5 % (ref 11.5–15.5)
WBC: 10.8 10*3/uL — AB (ref 4.0–10.5)

## 2018-05-10 LAB — PSA, MEDICARE: PSA: 0.58 ng/ml (ref 0.10–4.00)

## 2018-05-24 ENCOUNTER — Telehealth: Payer: Self-pay

## 2018-05-24 NOTE — Telephone Encounter (Signed)
Left message for patient to call back about referral. Pulmonary care office in [place] has reached out to the patient. Did he get their message? Patient needs to call them back to schedule for lung cancer screening

## 2018-06-30 ENCOUNTER — Other Ambulatory Visit: Payer: Self-pay | Admitting: Internal Medicine

## 2018-07-01 NOTE — Telephone Encounter (Signed)
Last filled 04-29-18 #60 Last OV  05-09-18 Next OV 04-2019 [facility]

## 2018-08-13 ENCOUNTER — Telehealth: Payer: Self-pay | Admitting: Internal Medicine

## 2018-08-13 NOTE — Telephone Encounter (Signed)
Pt's friend called requesting how to get the covid-19 testing. Stated that 2 people at his work was sent home and not sure if they had symptoms. The company is not telling the workers if any employees have tested positive. So he is asking to be tested. He is not having any symptoms. Advised that he may not be tested if he does not have symptoms or not been around any one that has tested positive.  He would like a call back.

## 2018-08-14 NOTE — Telephone Encounter (Signed)
Unable to reach [name] (did not see DPR) and unable to reach pt at home and work said he is 2nd shift.

## 2018-08-14 NOTE — Telephone Encounter (Signed)
Pt stated he was fine.  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  Offered to give pt [place] phone number for testing.  Pt declined he didn't want number.

## 2018-08-14 NOTE — Telephone Encounter (Signed)
FYI to [facility] CMA and Dr Alphonsus Sias.

## 2018-08-15 NOTE — Telephone Encounter (Signed)
Unfortunately, he doesn't fit the criteria for testing at this point---it is frustrating

## 2018-08-28 ENCOUNTER — Ambulatory Visit: Payer: Medicare Other | Admitting: Internal Medicine

## 2018-08-29 NOTE — Assessment & Plan Note (Signed)
Didn't show up --cancelled right before the visit Not sure what his issues were

## 2018-08-29 NOTE — Progress Notes (Signed)
Patient did not show up.

## 2018-08-30 ENCOUNTER — Other Ambulatory Visit: Payer: Self-pay | Admitting: Internal Medicine

## 2018-09-02 NOTE — Telephone Encounter (Signed)
Last filled 07-01-18 #60 Last OV 05-09-18 Next OV 05-16-19 [facility]

## 2018-11-07 ENCOUNTER — Telehealth: Payer: Self-pay | Admitting: Internal Medicine

## 2018-11-08 MED ORDER — GABAPENTIN 300 MG PO CAPS: ORAL_CAPSULE | ORAL | 3 refills | Status: DC

## 2018-11-08 NOTE — Telephone Encounter (Signed)
I sent in refills for the gabapentin.

## 2018-11-08 NOTE — Telephone Encounter (Signed)
Patient is also needing his Gabapentin sent in. Patient is almost out of medication

## 2018-11-08 NOTE — Addendum Note (Signed)
Addended by: Pilar Grammes on: 11/08/2018 02:43 PM   Modules accepted: Orders

## 2018-11-08 NOTE — Telephone Encounter (Signed)
Last filled 09-02-18 #60 Last OV 08-28-18 Next OV 05-16-19 [facility]

## 2019-01-08 ENCOUNTER — Other Ambulatory Visit: Payer: Self-pay | Admitting: Internal Medicine

## 2019-01-08 NOTE — Telephone Encounter (Signed)
[place] pt, last filled 11/08/2018 #60, last OV 04/2018 CPE, next OV 04/2019... please advise

## 2019-01-16 ENCOUNTER — Encounter: Payer: Self-pay | Admitting: Internal Medicine

## 2019-03-14 ENCOUNTER — Other Ambulatory Visit: Payer: Self-pay | Admitting: Internal Medicine

## 2019-03-15 NOTE — Telephone Encounter (Signed)
Last filled 01-08-19 #60 Last OV 08-28-18 No future OV [facility]

## 2019-05-05 ENCOUNTER — Other Ambulatory Visit: Payer: Self-pay | Admitting: Internal Medicine

## 2019-05-05 MED ORDER — GABAPENTIN 300 MG PO CAPS: ORAL_CAPSULE | ORAL | 11 refills | Status: DC

## 2019-05-05 NOTE — Telephone Encounter (Signed)
Let him know that I refilled the Rx. Try to reschedule the appt (it was because of me that it was cancelled)

## 2019-05-05 NOTE — Telephone Encounter (Signed)
Patient scheduled cpx on 07/07/19.  Patient wanted to make sure he could get the refill for Gabapentin.  He said he has 10 pills left.

## 2019-05-05 NOTE — Telephone Encounter (Signed)
Yes---okay to refill pending the rescheduled appointment

## 2019-05-05 NOTE — Telephone Encounter (Signed)
Patient called in regards to refill request He stated that he is also needing GABAPENTIN  Patient stated that he went to see his Dr about his Amputation.  They ended up doing a procedure to the area and since then he has been in pain  In order for him to sleep he stated he has been taking 2 of the Gabapentin.  Patient stated that because of this he is running out of medication and can not get it refilled until march  Please advise

## 2019-05-05 NOTE — Telephone Encounter (Signed)
Last filled 03-15-19 #60 Last OV 05-09-18 Next OV cancelled for 05-16-19. HAs not r/s. [facility]

## 2019-05-05 NOTE — Telephone Encounter (Signed)
Rx sent electronically.  

## 2019-05-14 NOTE — Telephone Encounter (Signed)
Spoke to pt. Advised him that the rx was sent 05-05-19 #90/11. He needs to speak to someone at the pharmacy.

## 2019-05-14 NOTE — Telephone Encounter (Signed)
Patient called back and stated that the pharmacy did not receive a prescription for his Gabapentin. Just the Tramadol. Patient stated that some nights he is having to take 2 tablets so now he only has 3 left.

## 2019-05-16 ENCOUNTER — Encounter: Payer: Medicare Other | Admitting: Internal Medicine

## 2019-05-16 NOTE — Addendum Note (Signed)
Addended by: Janelle Floor B on: 05/16/2019 02:28 PM   Modules accepted: Orders

## 2019-05-16 NOTE — Telephone Encounter (Signed)
Spoke to pt. He said that since he went to the prosthetic doctor he has had a lot of pain and sometimes he takes more than 1 gabapentin. He said he told Dr Alphonsus Sias this at his last OV. I reminded him that was back in February 2020. There is no documentation since then about him or his medications. He will try Tylenol, Advil, or Aleve for pain until Dr Alphonsus Sias will be available to approve more medication.

## 2019-05-16 NOTE — Telephone Encounter (Signed)
I don't feel comfortable increasing this to 600, as I have never seen him and don't know him. Can we give the pharmacy an overide to fill early for a 1 month supply. And did he schedule the appt Dr. Alphonsus Sias wanted him to schedule?

## 2019-05-16 NOTE — Telephone Encounter (Signed)
Patient contacted the office and stated that the pharmacy will not fill his Gabapentin.  I contacted the pharmacy and they state that the reason they will not fill it is because this was last refilled on 03/08/20 for #90 - and the directions are to take 1 tablet daily, so the patient should not be due for a refill until 3/13.  Patient states he has been taking 2 tablets at night instead of 1, which is why he needs a refill. The pharmacy states if they can get a refill with new directions that state TAKE 2 CAPSULES BY MOUTH EVERYDAY AT BEDTIME - then they can refill this medication for patient. Rene Kocher, is this ok?

## 2019-06-27 ENCOUNTER — Other Ambulatory Visit: Payer: Self-pay | Admitting: Internal Medicine

## 2019-06-30 NOTE — Telephone Encounter (Signed)
Last filled 06-06-19 #20 Last OV 08-28-18 Next OV 07-07-19 [facility]

## 2019-07-07 ENCOUNTER — Other Ambulatory Visit: Payer: Self-pay

## 2019-07-07 ENCOUNTER — Encounter: Payer: Self-pay | Admitting: Internal Medicine

## 2019-07-07 ENCOUNTER — Ambulatory Visit (INDEPENDENT_AMBULATORY_CARE_PROVIDER_SITE_OTHER): Payer: Medicare Other | Admitting: Internal Medicine

## 2019-07-07 VITALS — BP 120/78 | HR 78 | Temp 97.6°F | Ht 68.5 in | Wt 137.0 lb

## 2019-07-07 DIAGNOSIS — Z89511 Acquired absence of right leg below knee: Secondary | ICD-10-CM | POA: Diagnosis not present

## 2019-07-07 DIAGNOSIS — Z0000 Encounter for general adult medical examination without abnormal findings: Secondary | ICD-10-CM

## 2019-07-07 DIAGNOSIS — G546 Phantom limb syndrome with pain: Secondary | ICD-10-CM

## 2019-07-07 DIAGNOSIS — Z7189 Other specified counseling: Secondary | ICD-10-CM

## 2019-07-07 DIAGNOSIS — Z720 Tobacco use: Secondary | ICD-10-CM

## 2019-07-07 DIAGNOSIS — I10 Essential (primary) hypertension: Secondary | ICD-10-CM | POA: Diagnosis not present

## 2019-07-07 DIAGNOSIS — Z1211 Encounter for screening for malignant neoplasm of colon: Secondary | ICD-10-CM

## 2019-07-07 LAB — CBC
HCT: 43.6 % (ref 39.0–52.0)
Hemoglobin: 15.1 g/dL (ref 13.0–17.0)
MCHC: 34.8 g/dL (ref 30.0–36.0)
MCV: 94.7 fl (ref 78.0–100.0)
Platelets: 272 10*3/uL (ref 150.0–400.0)
RBC: 4.6 Mil/uL (ref 4.22–5.81)
RDW: 13.6 % (ref 11.5–15.5)
WBC: 10.5 10*3/uL (ref 4.0–10.5)

## 2019-07-07 MED ORDER — TRAMADOL HCL 50 MG PO TABS: 50.0000 mg | ORAL_TABLET | Freq: Three times a day (TID) | ORAL | 0 refills | Status: DC | PRN

## 2019-07-07 MED ORDER — GABAPENTIN 300 MG PO CAPS: 300.0000 mg | ORAL_CAPSULE | Freq: Every day | ORAL | 3 refills | Status: DC

## 2019-07-07 NOTE — Assessment & Plan Note (Signed)
Gets along fine with the prosthesis----though it causes pain by the end of the day
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
## 2019-07-07 NOTE — Assessment & Plan Note (Signed)
Urged him and girlfriend to stop

## 2019-07-07 NOTE — Assessment & Plan Note (Signed)
See social history Blank forms given 

## 2019-07-07 NOTE — Assessment & Plan Note (Signed)
I have personally reviewed the Medicare Annual Wellness questionnaire and have noted 1. The patient's medical and social history 2. Their use of alcohol, tobacco or illicit drugs 3. Their current medications and supplements 4. The patient's functional ability including ADL's, fall risks, home safety risks and hearing or visual             impairment. 5. Diet and physical activities 6. Evidence for depression or mood disorders  The patients weight, height, BMI and visual acuity have been recorded in the chart I have made referrals, counseling and provided education to the patient based review of the above and I have provided the pt with a written personalized care plan for preventive services.  I have provided you with a copy of your personalized plan for preventive services. Please take the time to review along with your updated medication list.  Got first Pfizer vaccine Flu vaccine in the fall FIT---told him to do right away Defer PSA to next year

## 2019-07-07 NOTE — Assessment & Plan Note (Signed)
Mostly affects him at night Continues on the gabapentin

## 2019-07-07 NOTE — Assessment & Plan Note (Signed)
BP Readings from Last 3 Encounters:  07/07/19 120/78  05/09/18 132/80  06/18/17 106/90   Continues to do fine without Rx now

## 2019-07-07 NOTE — Progress Notes (Signed)
Hearing Screening   125Hz  250Hz  500Hz  1000Hz  2000Hz  3000Hz  4000Hz  6000Hz  8000Hz   Right ear:           Left ear:           Comments: Tested at work every other month   Visual Acuity Screening   Right eye Left eye Both eyes  Without correction: 20/30 20/20 20/20   With correction:     .

## 2019-07-08 LAB — COMPREHENSIVE METABOLIC PANEL
ALT: 15 U/L (ref 0–53)
AST: 19 U/L (ref 0–37)
Albumin: 4.2 g/dL (ref 3.5–5.2)
Alkaline Phosphatase: 67 U/L (ref 39–117)
BUN: 21 mg/dL (ref 6–23)
CO2: 24 mEq/L (ref 19–32)
Calcium: 9.1 mg/dL (ref 8.4–10.5)
Chloride: 105 mEq/L (ref 96–112)
Creatinine, Ser: 0.96 mg/dL (ref 0.40–1.50)
GFR: 80.77 mL/min (ref >60.00)
Glucose, Bld: 69 mg/dL — ABNORMAL LOW (ref 70–99)
Potassium: 4 mEq/L (ref 3.5–5.1)
Sodium: 138 mEq/L (ref 135–145)
Total Bilirubin: 0.5 mg/dL (ref 0.2–1.2)
Total Protein: 6.6 g/dL (ref 6.0–8.3)

## 2019-07-08 LAB — LIPID PANEL
Cholesterol: 141 mg/dL (ref 0–200)
HDL: 74.6 mg/dL (ref >39.00)
NonHDL: 66.87
Total CHOL/HDL Ratio: 2
Triglycerides: 222 mg/dL — ABNORMAL HIGH (ref 0.0–149.0)
VLDL: 44.4 mg/dL — ABNORMAL HIGH (ref 0.0–40.0)

## 2019-07-08 LAB — LDL CHOLESTEROL, DIRECT: Direct LDL: 22 mg/dL

## 2019-07-15 ENCOUNTER — Other Ambulatory Visit (INDEPENDENT_AMBULATORY_CARE_PROVIDER_SITE_OTHER): Payer: Medicare Other

## 2019-07-15 DIAGNOSIS — Z1211 Encounter for screening for malignant neoplasm of colon: Secondary | ICD-10-CM | POA: Diagnosis not present

## 2019-07-15 LAB — FECAL OCCULT BLOOD, IMMUNOCHEMICAL: Fecal Occult Bld: NEGATIVE

## 2019-08-29 ENCOUNTER — Other Ambulatory Visit: Payer: Self-pay | Admitting: Internal Medicine

## 2019-08-29 NOTE — Telephone Encounter (Signed)
Last filled 08-08-19 Last OV 07-07-19 Next OV 07-12-20 [facility]

## 2019-10-27 ENCOUNTER — Telehealth: Payer: Self-pay

## 2019-10-27 ENCOUNTER — Telehealth: Payer: Self-pay | Admitting: *Deleted

## 2019-10-27 MED ORDER — TRAMADOL HCL 50 MG PO TABS: 50.0000 mg | ORAL_TABLET | Freq: Three times a day (TID) | ORAL | 0 refills | Status: DC | PRN

## 2019-10-27 NOTE — Telephone Encounter (Signed)
Spoke to [employer] at [facility] [employer]. She said rx can say eval and treat for prosthetic foot and supplies.  I wrote out the rx for Dr Alphonsus Sias and faxed it to the number provided.

## 2019-10-27 NOTE — Telephone Encounter (Signed)
Left message on VM for [facility] on verified VM.

## 2019-10-27 NOTE — Telephone Encounter (Signed)
Patient contacted the office and states he has a prosthetic leg, and he has an appt with [facility] on 8/12 - and they are needing an order for new socks and a right foot faxed over to their office. Their fax is [phone]. If you have any questions they can be reached at [phone]

## 2019-10-27 NOTE — Telephone Encounter (Signed)
Please check with [facility] and find out exactly what he needs (they can either send Rx or write exactly what they think he needs so we get it right)

## 2019-10-27 NOTE — Telephone Encounter (Signed)
Pt has been on his legs a lot more. He has only been taking tramadol once a day. Asking if he can increase it to 2 to 3 a day and get a new rx for a higher quantity sent to [facility]

## 2019-10-27 NOTE — Telephone Encounter (Signed)
[name] patient's girlfriend (not on [place]) left a voicemail stating that they need his tramadol increased on how many he can take a day. Message was left to call back after 4:00 because they have to go to a funeral today. Called and left message on patient's voicemail to get more information since [name] is not on his DPR.

## 2019-10-27 NOTE — Telephone Encounter (Signed)
Please let him know I increased the prescription back to 60 per month

## 2019-11-20 DIAGNOSIS — Z89511 Acquired absence of right leg below knee: Secondary | ICD-10-CM | POA: Diagnosis not present

## 2019-12-24 ENCOUNTER — Other Ambulatory Visit: Payer: Self-pay | Admitting: Internal Medicine

## 2019-12-24 NOTE — Telephone Encounter (Signed)
Last filled 10-29-19 #60 Last OV 07-07-19 Next OV 07-13-19 [facility]

## 2020-02-10 ENCOUNTER — Other Ambulatory Visit: Payer: Self-pay | Admitting: Internal Medicine

## 2020-02-10 NOTE — Telephone Encounter (Signed)
Last filled 12-24-19 #60 Last OV 07-07-19 Next OV 07-12-20 [facility]

## 2020-02-18 ENCOUNTER — Telehealth: Payer: Self-pay | Admitting: Internal Medicine

## 2020-02-18 ENCOUNTER — Encounter: Payer: Self-pay | Admitting: Internal Medicine

## 2020-02-18 NOTE — Telephone Encounter (Signed)
I left 2 messages for patient to return my call with no response.  Patient needs to r/s his cpx with Dr.Letvak on 07/12/20. I mailed a reschedule letter to patient.

## 2020-03-31 ENCOUNTER — Other Ambulatory Visit: Payer: Self-pay | Admitting: Internal Medicine

## 2020-04-30 ENCOUNTER — Telehealth: Payer: Self-pay | Admitting: Internal Medicine

## 2020-04-30 NOTE — Telephone Encounter (Signed)
Patient is needing socks for his prosthetic leg.    Please send to [facility]

## 2020-05-03 NOTE — Telephone Encounter (Signed)
Spoke to [employer] at [employer]. His rx is not due until the end of February. His practitioner there is not in the office. She will get with him and find out what to do about getting an order to [place].

## 2020-05-16 ENCOUNTER — Other Ambulatory Visit: Payer: Self-pay | Admitting: Internal Medicine

## 2020-05-17 ENCOUNTER — Telehealth: Payer: Self-pay | Admitting: Internal Medicine

## 2020-05-17 NOTE — Telephone Encounter (Signed)
Please call Dylan Mckenzie [phone] . She has questions about his Tramadol. EM

## 2020-05-17 NOTE — Telephone Encounter (Addendum)
Spoke to pt. They were calling to ask if the rx had been called in. I advised him it was done at 130pm today. We had availability for a CPE in April. I rescheduled his appt that was made for August.

## 2020-05-17 NOTE — Telephone Encounter (Signed)
Rx done He needs PE in 3-6 months

## 2020-05-17 NOTE — Telephone Encounter (Signed)
I left a message on patient's voice mail to return my call.  Please schedule cpx with Dr.Letvak in 3-6 months.

## 2020-05-17 NOTE — Telephone Encounter (Signed)
Last filled 04-01-20 #60 Last OV/CPE 07-07-19 No Future OV [facility]

## 2020-06-08 DIAGNOSIS — Z89511 Acquired absence of right leg below knee: Secondary | ICD-10-CM | POA: Diagnosis not present

## 2020-07-09 ENCOUNTER — Other Ambulatory Visit: Payer: Self-pay | Admitting: Internal Medicine

## 2020-07-12 ENCOUNTER — Encounter: Payer: Medicare Other | Admitting: Internal Medicine

## 2020-07-12 NOTE — Telephone Encounter (Signed)
Last filled 05-17-20 #60 Last OV 07-07-19 Next OV 07-15-20 [facility]

## 2020-07-15 ENCOUNTER — Encounter: Payer: Self-pay | Admitting: Internal Medicine

## 2020-07-15 ENCOUNTER — Ambulatory Visit (INDEPENDENT_AMBULATORY_CARE_PROVIDER_SITE_OTHER): Payer: Medicare Other | Admitting: Internal Medicine

## 2020-07-15 ENCOUNTER — Other Ambulatory Visit: Payer: Self-pay

## 2020-07-15 VITALS — BP 124/82 | HR 66 | Temp 98.0°F | Ht 69.0 in | Wt 142.0 lb

## 2020-07-15 DIAGNOSIS — Z720 Tobacco use: Secondary | ICD-10-CM | POA: Diagnosis not present

## 2020-07-15 DIAGNOSIS — Z7189 Other specified counseling: Secondary | ICD-10-CM

## 2020-07-15 DIAGNOSIS — I10 Essential (primary) hypertension: Secondary | ICD-10-CM | POA: Diagnosis not present

## 2020-07-15 DIAGNOSIS — Z89511 Acquired absence of right leg below knee: Secondary | ICD-10-CM | POA: Diagnosis not present

## 2020-07-15 DIAGNOSIS — Z1211 Encounter for screening for malignant neoplasm of colon: Secondary | ICD-10-CM

## 2020-07-15 DIAGNOSIS — Z0000 Encounter for general adult medical examination without abnormal findings: Secondary | ICD-10-CM

## 2020-07-15 DIAGNOSIS — G546 Phantom limb syndrome with pain: Secondary | ICD-10-CM | POA: Diagnosis not present

## 2020-07-15 NOTE — Assessment & Plan Note (Signed)
I have personally reviewed the Medicare Annual Wellness questionnaire and have noted 1. The patient's medical and social history 2. Their use of alcohol, tobacco or illicit drugs 3. Their current medications and supplements 4. The patient's functional ability including ADL's, fall risks, home safety risks and hearing or visual             impairment. 5. Diet and physical activities 6. Evidence for depression or mood disorders  The patients weight, height, BMI and visual acuity have been recorded in the chart I have made referrals, counseling and provided education to the patient based review of the above and I have provided the pt with a written personalized care plan for preventive services.  I have provided you with a copy of your personalized plan for preventive services. Please take the time to review along with your updated medication list.  Flu vaccine in the fall I recommend the COVID booster--he is not sure FIT--doesn't want colon Will check PSA after discussion Consider shingrix at pharmacy (price an issue) Discussed exercise

## 2020-07-15 NOTE — Assessment & Plan Note (Signed)
Takes the tramadol in the morning and gabapentin at night

## 2020-07-15 NOTE — Assessment & Plan Note (Signed)
See social history 

## 2020-07-15 NOTE — Assessment & Plan Note (Signed)
Urged him and wife to stop smoking (or at least cut back)
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
## 2020-07-15 NOTE — Progress Notes (Signed)
Hearing Screening   Method: Audiometry   125Hz  250Hz  500Hz  1000Hz  2000Hz  3000Hz  4000Hz  6000Hz  8000Hz   Right ear:   20 20 20   0    Left ear:   20 20 20  20       Visual Acuity Screening   Right eye Left eye Both eyes  Without correction: 20/40 20/25 20/25   With correction:

## 2020-07-15 NOTE — Assessment & Plan Note (Signed)
Doing well with prosthesis

## 2020-07-15 NOTE — Assessment & Plan Note (Signed)
BP Readings from Last 3 Encounters:  07/15/20 124/82  07/07/19 120/78  05/09/18 132/80   Good control without meds still

## 2020-07-16 LAB — COMPREHENSIVE METABOLIC PANEL
ALT: 11 U/L (ref 0–53)
AST: 17 U/L (ref 0–37)
Albumin: 4.1 g/dL (ref 3.5–5.2)
Alkaline Phosphatase: 62 U/L (ref 39–117)
BUN: 19 mg/dL (ref 6–23)
CO2: 25 mEq/L (ref 19–32)
Calcium: 9 mg/dL (ref 8.4–10.5)
Chloride: 103 mEq/L (ref 96–112)
Creatinine, Ser: 1.09 mg/dL (ref 0.40–1.50)
GFR: 75.14 mL/min (ref >60.00)
Glucose, Bld: 69 mg/dL — ABNORMAL LOW (ref 70–99)
Potassium: 3.9 mEq/L (ref 3.5–5.1)
Sodium: 136 mEq/L (ref 135–145)
Total Bilirubin: 0.6 mg/dL (ref 0.2–1.2)
Total Protein: 6.8 g/dL (ref 6.0–8.3)

## 2020-07-16 LAB — CBC
HCT: 42.8 % (ref 39.0–52.0)
Hemoglobin: 14.6 g/dL (ref 13.0–17.0)
MCHC: 34 g/dL (ref 30.0–36.0)
MCV: 93.1 fl (ref 78.0–100.0)
Platelets: 270 10*3/uL (ref 150.0–400.0)
RBC: 4.6 Mil/uL (ref 4.22–5.81)
RDW: 13 % (ref 11.5–15.5)
WBC: 9.5 10*3/uL (ref 4.0–10.5)

## 2020-09-10 ENCOUNTER — Other Ambulatory Visit: Payer: Self-pay

## 2020-09-10 MED ORDER — TRAMADOL HCL 50 MG PO TABS
50.0000 mg | ORAL_TABLET | Freq: Three times a day (TID) | ORAL | 0 refills | Status: DC | PRN
Start: 2020-09-10 — End: 2020-11-09

## 2020-09-10 NOTE — Telephone Encounter (Signed)
Pts wife said called [facility] on 09/09/20 and requested refill of tramadol and med refill has not been done per pharmacy and pt is out of medication; took last pill this morning.  Name of Medication: tramadol 50 mg Name of Pharmacy: [facility] Last Fill or Written Date and Quantity: # 60 on 07/12/20 Last Office Visit and Type: 07/15/20 annual exam Next Office Visit and Type: 07/21/2021 annual Last Controlled Substance Agreement Date: none seen Last UDS: none seen

## 2020-09-10 NOTE — Telephone Encounter (Signed)
Patients wife called back very upset and somewhat irate that her husband's tramadol has not been refilled. Patients wife is requesting this be filled as soon as possible as he will be out of it tomorrow. EM

## 2020-09-10 NOTE — Telephone Encounter (Signed)
[facility] Night - Client Nonclinical Telephone Record AccessNurse Client [facility] Night - Client Client Site [facility] - Night Physician Tillman Abide- MD Contact Type Call Who Is Calling Patient / Member / Family / Caregiver Caller Name Clydene Laming Phone Number [phone] Patient Name Dylan Mckenzie Patient DOB 1960/05/26 Call Type Message Only Information Provided Reason for Call Medication Question / Request Initial Comment Caller states she needs her husband medication called in. Disp. Time Disposition Final User 09/10/2020 8:02:58 AM General Information Provided Yes Donnelly Angelica Call Closed By: Donnelly Angelica Transaction Date/Time: 09/10/2020 8:00:50 AM (ET)

## 2020-10-07 ENCOUNTER — Other Ambulatory Visit: Payer: Self-pay | Admitting: Internal Medicine

## 2020-11-09 ENCOUNTER — Other Ambulatory Visit: Payer: Self-pay | Admitting: Internal Medicine

## 2020-11-09 NOTE — Telephone Encounter (Signed)
Last filled 09-10-20 #60 Last OV 4-21-2 Next OV 07-21-21 [facility]

## 2020-11-18 ENCOUNTER — Encounter: Payer: Medicare Other | Admitting: Internal Medicine

## 2020-12-26 ENCOUNTER — Other Ambulatory Visit: Payer: Self-pay | Admitting: Internal Medicine

## 2020-12-27 NOTE — Telephone Encounter (Signed)
Last filled 11-09-20 #60 Last OV 07-15-20 Next OV 07-21-21 [facility]

## 2021-02-16 ENCOUNTER — Other Ambulatory Visit: Payer: Self-pay | Admitting: Internal Medicine

## 2021-02-16 NOTE — Telephone Encounter (Signed)
Last filled 12-27-20 #60 Last OV 07-15-20 Next OV 07-21-21 [facility]

## 2021-03-03 DIAGNOSIS — Z89511 Acquired absence of right leg below knee: Secondary | ICD-10-CM | POA: Diagnosis not present

## 2021-04-09 DIAGNOSIS — M542 Cervicalgia: Secondary | ICD-10-CM | POA: Diagnosis not present

## 2021-04-19 ENCOUNTER — Other Ambulatory Visit: Payer: Self-pay | Admitting: Internal Medicine

## 2021-04-19 NOTE — Telephone Encounter (Signed)
Last filled 02-16-21 #60 Last OV 07-15-20 Next OV 07-21-21 [facility]

## 2021-04-22 ENCOUNTER — Encounter: Payer: Self-pay | Admitting: Internal Medicine

## 2021-04-22 ENCOUNTER — Other Ambulatory Visit: Payer: Self-pay

## 2021-04-22 ENCOUNTER — Ambulatory Visit (INDEPENDENT_AMBULATORY_CARE_PROVIDER_SITE_OTHER): Payer: Medicare Other | Admitting: Internal Medicine

## 2021-04-22 DIAGNOSIS — S46819A Strain of other muscles, fascia and tendons at shoulder and upper arm level, unspecified arm, initial encounter: Secondary | ICD-10-CM | POA: Insufficient documentation

## 2021-04-22 MED ORDER — MELOXICAM 15 MG PO TABS: 15.0000 mg | ORAL_TABLET | Freq: Every day | ORAL | 0 refills | Status: DC | PRN

## 2021-04-22 MED ORDER — TIZANIDINE HCL 2 MG PO TABS: 2.0000 mg | ORAL_TABLET | Freq: Three times a day (TID) | ORAL | 1 refills | Status: DC | PRN

## 2021-04-22 NOTE — Assessment & Plan Note (Signed)
Okay to continue the meloxicam Topical Rx not helpful Discussed head position at work--not much he can do about it Will try tizanidine for prn
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
## 2021-05-20 ENCOUNTER — Other Ambulatory Visit: Payer: Self-pay | Admitting: Internal Medicine

## 2021-06-17 ENCOUNTER — Other Ambulatory Visit: Payer: Self-pay | Admitting: Internal Medicine

## 2021-06-17 NOTE — Telephone Encounter (Signed)
Last filled 04-19-21 #60 ?Last OV 04-22-21 ?Next OV 07-21-21 ?[facility] ?

## 2021-07-18 ENCOUNTER — Other Ambulatory Visit: Payer: Self-pay | Admitting: Internal Medicine

## 2021-07-21 ENCOUNTER — Encounter: Payer: Medicare Other | Admitting: Internal Medicine

## 2021-08-09 ENCOUNTER — Other Ambulatory Visit: Payer: Self-pay | Admitting: Internal Medicine

## 2021-08-09 NOTE — Telephone Encounter (Signed)
Last filled 06-17-21 #60 ?Last OV 04-22-21 ?No Future OV No Show 07-21-21 ?[facility] ?

## 2021-10-10 ENCOUNTER — Other Ambulatory Visit: Payer: Self-pay | Admitting: Internal Medicine

## 2021-10-10 NOTE — Telephone Encounter (Signed)
Last filled 08-10-21 #60 Last OV 04-22-21 No Future OV No Show 07-21-21 [facility]

## 2021-10-10 NOTE — Telephone Encounter (Signed)
Have him reschedule PE for the next couple of months (needs to be before his next refill)

## 2021-10-10 NOTE — Telephone Encounter (Signed)
Patient took his last pill this morning, will need for in the morning  Request to fill asap

## 2021-10-11 NOTE — Telephone Encounter (Signed)
Left a message on verified VM to call the office to schedule a CPE in the next 1-2 months prior to his next tramadol refill.

## 2021-11-11 ENCOUNTER — Other Ambulatory Visit: Payer: Self-pay | Admitting: Internal Medicine

## 2021-12-02 ENCOUNTER — Other Ambulatory Visit: Payer: Self-pay | Admitting: Internal Medicine

## 2021-12-02 NOTE — Telephone Encounter (Signed)
LVM for patient to call back and schedule

## 2021-12-02 NOTE — Telephone Encounter (Signed)
Last filled 10-10-21 #60 Last OV 04-22-21 No Future OV No Show 07-21-21 [facility]

## 2021-12-02 NOTE — Telephone Encounter (Signed)
Have him set up PE in the next couple of months

## 2021-12-07 ENCOUNTER — Telehealth: Payer: Self-pay

## 2021-12-07 NOTE — Telephone Encounter (Signed)
Dylan Mckenzie with [facility] called to ck on status of faxed paper work on 11/21/21;11/29/21 and 12/07/21 to [phone]. [facility] request form filled out and sent back with office notes. Copied form from [employer] and put in Dr Karle Starch in box.

## 2021-12-22 DIAGNOSIS — Z89511 Acquired absence of right leg below knee: Secondary | ICD-10-CM | POA: Diagnosis not present

## 2022-01-31 ENCOUNTER — Other Ambulatory Visit: Payer: Self-pay | Admitting: Internal Medicine

## 2022-02-01 ENCOUNTER — Telehealth: Payer: Self-pay | Admitting: Internal Medicine

## 2022-02-01 MED ORDER — TRAMADOL HCL 50 MG PO TABS: 50.0000 mg | ORAL_TABLET | Freq: Three times a day (TID) | ORAL | 0 refills | Status: DC | PRN

## 2022-02-01 NOTE — Telephone Encounter (Signed)
Patient wife called and stated that patient needs Tramadol and it was denied yesterday.

## 2022-02-01 NOTE — Telephone Encounter (Signed)
Great. Thank you.

## 2022-02-01 NOTE — Telephone Encounter (Signed)
Spoke to pt, scheduled cpe for 04/20/22

## 2022-02-01 NOTE — Telephone Encounter (Signed)
In the denial it states pt need to call office to set up an appointment. He is past due for an office visit/CPE. Last CPE 07-15-20. Was seen for acute issue 04-22-21. Will forward to Dr Alphonsus Sias to do refill and then send to support pool to set up CPE.

## 2022-03-27 ENCOUNTER — Other Ambulatory Visit: Payer: Self-pay | Admitting: Internal Medicine

## 2022-03-28 NOTE — Telephone Encounter (Signed)
Last filled 02-01-22 #60 Last OV 02-20-22 Next OV 04-20-22 [facility]

## 2022-04-20 ENCOUNTER — Encounter: Payer: 59 | Admitting: Internal Medicine

## 2022-05-01 ENCOUNTER — Telehealth: Payer: Self-pay | Admitting: Internal Medicine

## 2022-05-01 NOTE — Telephone Encounter (Signed)
Dr Silvio Pate is with pts right now. Pt has been a No Show for several appts. Last CPE 06/2020. Last OV Acute 03/2021.   I called Susie at [place] and advised Dr Silvio Pate can look in to this when he is done with his morning pts. She stated she faxed a form to be filled out. I will check the [employer] to see if we have it.

## 2022-05-01 NOTE — Telephone Encounter (Signed)
Form printed from [employer] and placed in Dr Alla German inbox on his desk

## 2022-05-01 NOTE — Telephone Encounter (Signed)
Form faxed back.

## 2022-05-01 NOTE — Telephone Encounter (Signed)
[facility] called in to askefd if patient needs to have preantibiotic treatment before any dental procedures?He is at their office now to have teeth abstracted and dentures. He also have a prosthetic limb and pins.

## 2022-05-24 ENCOUNTER — Other Ambulatory Visit: Payer: Self-pay | Admitting: Internal Medicine

## 2022-05-24 ENCOUNTER — Telehealth: Payer: Self-pay | Admitting: Internal Medicine

## 2022-05-24 NOTE — Telephone Encounter (Signed)
Prescription Request  05/24/2022  Is this a "Controlled Substance" medicine? No  LOV: Visit date not found  What is the name of the medication or equipment?   traMADol (ULTRAM) 50 MG tablet gabapentin (NEURONTIN) 300 MG capsule  Have you contacted your pharmacy to request a refill? Yes   Which pharmacy would you like this sent to?  [id number] [place] [place] [id number]: [phone]: [phone]   Patient notified that their request is being sent to the clinical staff for review and that they should receive a response within 2 business days.   Please advise at Mobile [phone](mobile)

## 2022-05-24 NOTE — Telephone Encounter (Signed)
We already received a refill request from the pharmacy

## 2022-05-24 NOTE — Telephone Encounter (Signed)
Last filled 03-28-22 #60 Last OV 02-20-22 No Future OV  No Show 04-20-22 [facility]

## 2022-07-17 ENCOUNTER — Other Ambulatory Visit: Payer: Self-pay | Admitting: Internal Medicine

## 2022-07-17 NOTE — Telephone Encounter (Signed)
LVMTCB and resched

## 2022-07-17 NOTE — Telephone Encounter (Signed)
Refill done but he needs to schedule AMW within next 1-2 months (and before his next refill)

## 2022-07-17 NOTE — Telephone Encounter (Signed)
Last filled 05-24-22 #60 Last OV 02-20-22 No Future OV  No Show 04-20-22 [facility]

## 2022-08-07 ENCOUNTER — Telehealth: Payer: Self-pay | Admitting: Internal Medicine

## 2022-08-07 NOTE — Telephone Encounter (Signed)
Patient wife called in and stated that patient was summons for [employer]. She stated that he needs a letter by June 3rd stating that he has a disability to be excused from [employer]. She stated that he has a prosthetic leg and when he sits for long periods of time it cause a lot of pain. She stated that he is Juror #11 and will have to come on June 10th. Thank you!

## 2022-08-07 NOTE — Telephone Encounter (Signed)
Patient will pick up letter when he comes for appt on 08/11/22. Placed up front for pickup. Charge sheet attached

## 2022-08-11 ENCOUNTER — Ambulatory Visit (INDEPENDENT_AMBULATORY_CARE_PROVIDER_SITE_OTHER): Payer: 59 | Admitting: Internal Medicine

## 2022-08-11 ENCOUNTER — Encounter: Payer: Self-pay | Admitting: Internal Medicine

## 2022-08-11 VITALS — BP 114/68 | HR 65 | Temp 98.2°F | Ht 69.0 in | Wt 144.2 lb

## 2022-08-11 DIAGNOSIS — Z720 Tobacco use: Secondary | ICD-10-CM | POA: Diagnosis not present

## 2022-08-11 DIAGNOSIS — I10 Essential (primary) hypertension: Secondary | ICD-10-CM

## 2022-08-11 DIAGNOSIS — Z125 Encounter for screening for malignant neoplasm of prostate: Secondary | ICD-10-CM

## 2022-08-11 DIAGNOSIS — Z0000 Encounter for general adult medical examination without abnormal findings: Secondary | ICD-10-CM

## 2022-08-11 DIAGNOSIS — Z89511 Acquired absence of right leg below knee: Secondary | ICD-10-CM | POA: Diagnosis not present

## 2022-08-11 DIAGNOSIS — Z1211 Encounter for screening for malignant neoplasm of colon: Secondary | ICD-10-CM

## 2022-08-11 DIAGNOSIS — G546 Phantom limb syndrome with pain: Secondary | ICD-10-CM

## 2022-08-11 LAB — CBC
HCT: 43.7 % (ref 39.0–52.0)
Hemoglobin: 14.9 g/dL (ref 13.0–17.0)
MCHC: 34.1 g/dL (ref 30.0–36.0)
MCV: 92.2 fl (ref 78.0–100.0)
Platelets: 287 10*3/uL (ref 150.0–400.0)
RBC: 4.74 Mil/uL (ref 4.22–5.81)
RDW: 13.6 % (ref 11.5–15.5)
WBC: 8.3 10*3/uL (ref 4.0–10.5)

## 2022-08-11 LAB — COMPREHENSIVE METABOLIC PANEL
ALT: 9 U/L (ref 0–53)
AST: 13 U/L (ref 0–37)
Albumin: 4.2 g/dL (ref 3.5–5.2)
Alkaline Phosphatase: 71 U/L (ref 39–117)
BUN: 13 mg/dL (ref 6–23)
CO2: 28 mEq/L (ref 19–32)
Calcium: 9.3 mg/dL (ref 8.4–10.5)
Chloride: 103 mEq/L (ref 96–112)
Creatinine, Ser: 0.96 mg/dL (ref 0.40–1.50)
GFR: 86.24 mL/min (ref >60.00)
Glucose, Bld: 75 mg/dL (ref 70–99)
Potassium: 4.1 mEq/L (ref 3.5–5.1)
Sodium: 137 mEq/L (ref 135–145)
Total Bilirubin: 0.5 mg/dL (ref 0.2–1.2)
Total Protein: 6.7 g/dL (ref 6.0–8.3)

## 2022-08-11 LAB — PSA, MEDICARE: PSA: 0.44 ng/ml (ref 0.10–4.00)

## 2022-08-11 NOTE — Assessment & Plan Note (Signed)
Uses the gabapentin 300mg  at night--may repeat if up in the night

## 2022-08-11 NOTE — Assessment & Plan Note (Signed)
I have personally reviewed the Medicare Annual Wellness questionnaire and have noted 1. The patient's medical and social history 2. Their use of alcohol, tobacco or illicit drugs 3. Their current medications and supplements 4. The patient's functional ability including ADL's, fall risks, home safety risks and hearing or visual             impairment. 5. Diet and physical activities 6. Evidence for depression or mood disorders  The patients weight, height, BMI and visual acuity have been recorded in the chart I have made referrals, counseling and provided education to the patient based review of the above and I have provided the pt with a written personalized care plan for preventive services.  I have provided you with a copy of your personalized plan for preventive services. Please take the time to review along with your updated medication list.  Will do FIT Check PSA after discussion Stays active Flu vaccine in the fall Td, shingrix and updated COVID at the pharmacy

## 2022-08-11 NOTE — Assessment & Plan Note (Signed)
BP Readings from Last 3 Encounters:  08/11/22 114/68  04/22/21 120/86  07/15/20 124/82   Blood pressure has been good lately without Rx

## 2022-08-11 NOTE — Assessment & Plan Note (Signed)
Discussed cessation He thinks he can quit cold turkey--I recommended at least nicotine lozenges for prn use after quitting

## 2022-08-11 NOTE — Assessment & Plan Note (Signed)
Uses tramadol in the morning to help tolerate the prosthesis
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
## 2022-09-11 ENCOUNTER — Other Ambulatory Visit: Payer: Self-pay | Admitting: Internal Medicine

## 2022-10-09 ENCOUNTER — Ambulatory Visit: Payer: 59 | Admitting: Internal Medicine

## 2022-10-10 ENCOUNTER — Encounter: Payer: Self-pay | Admitting: Internal Medicine

## 2022-10-10 ENCOUNTER — Telehealth: Payer: Self-pay | Admitting: Internal Medicine

## 2022-10-10 NOTE — Telephone Encounter (Signed)
I called and spoke to [facility] at [facility]. She stated she has to to have F2F encounter notes from [place] along with a rx. I advised her that he did not keep his appt with [place] yesterday. She said she will call him and advise that he has to keep an appointment with [place] to be able to see them.

## 2022-10-10 NOTE — Telephone Encounter (Signed)
[facility] rx form in Inbox on Dr Karle Starch desk.

## 2022-10-10 NOTE — Telephone Encounter (Signed)
Patient wife [name] called in and stated that patient needs a new order for a prosthetic foot and grey socks. She stated that this order needs to be sent over to [facility] on [address] in [place]. AttenBrett Canales Thank you!

## 2022-10-12 ENCOUNTER — Ambulatory Visit: Payer: 59 | Admitting: Internal Medicine

## 2022-10-23 ENCOUNTER — Other Ambulatory Visit: Payer: Self-pay | Admitting: Internal Medicine

## 2022-10-24 NOTE — Telephone Encounter (Signed)
Last filled 09-11-22 #60 Last OV 08-11-22 No Future OV  [facility]

## 2022-12-13 ENCOUNTER — Other Ambulatory Visit: Payer: Self-pay | Admitting: Internal Medicine

## 2022-12-13 NOTE — Telephone Encounter (Signed)
Last filled 10-24-22 #60 Last OV 08-11-22 No Future OV  [facility]

## 2022-12-26 ENCOUNTER — Ambulatory Visit (INDEPENDENT_AMBULATORY_CARE_PROVIDER_SITE_OTHER): Payer: Medicare HMO | Admitting: Internal Medicine

## 2022-12-26 ENCOUNTER — Encounter: Payer: Self-pay | Admitting: Internal Medicine

## 2022-12-26 VITALS — BP 118/76 | HR 72 | Temp 98.1°F | Ht 69.0 in | Wt 137.0 lb

## 2022-12-26 DIAGNOSIS — Z89511 Acquired absence of right leg below knee: Secondary | ICD-10-CM | POA: Diagnosis not present

## 2022-12-26 NOTE — Assessment & Plan Note (Signed)
He has done well with his prosthesis--but he gets severe pain in the right stump if he is on his feet for prolonged time---especially on the concrete floor at work He should still limit his work to maximum 4 hours per day Still disabled from this---cannot work full time at his employment
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
## 2022-12-29 ENCOUNTER — Other Ambulatory Visit: Payer: Self-pay | Admitting: Internal Medicine

## 2022-12-29 DIAGNOSIS — Z1211 Encounter for screening for malignant neoplasm of colon: Secondary | ICD-10-CM

## 2022-12-29 DIAGNOSIS — Z1212 Encounter for screening for malignant neoplasm of rectum: Secondary | ICD-10-CM

## 2023-01-15 ENCOUNTER — Telehealth: Payer: Self-pay

## 2023-01-15 ENCOUNTER — Telehealth: Payer: Self-pay | Admitting: Internal Medicine

## 2023-01-15 NOTE — Telephone Encounter (Signed)
I do not see anything, either. Will forward to Dr Alphonsus Sias to see if he remembers doing a note or letter at his recent OV.

## 2023-01-15 NOTE — Telephone Encounter (Signed)
Left detailed message on VM for [facility].

## 2023-01-15 NOTE — Telephone Encounter (Signed)
Patients wife would like to know if she could have a copy of the letter that was originally given to her husband at his visit on 12/26/2022?She said that they are trying to appeal his disability,and would like a copy to send in with the new paperwork that they are going to send back. I did not see letter in the letter section in his chart.

## 2023-01-15 NOTE — Telephone Encounter (Signed)
[facility] said faxed to [phone] on 01/09/23 request for written order not rx for prosthetic aupplies pt needs.[facility] wants to verify was received and needs Dr Alphonsus Sias to list prosthetic supplies that he wrote on rx. Will also need clinical notes for prior auth. [facility] will refax request 01/15/23. Sending note to [place] pool.

## 2023-01-16 NOTE — Telephone Encounter (Signed)
We received this and faxed it back yesterday. I will fax it again if needed.

## 2023-01-20 DIAGNOSIS — Z1212 Encounter for screening for malignant neoplasm of rectum: Secondary | ICD-10-CM | POA: Diagnosis not present

## 2023-01-20 DIAGNOSIS — Z1211 Encounter for screening for malignant neoplasm of colon: Secondary | ICD-10-CM | POA: Diagnosis not present

## 2023-01-25 NOTE — Telephone Encounter (Signed)
Left another message on VM for [facility].

## 2023-01-28 LAB — COLOGUARD: COLOGUARD: NEGATIVE

## 2023-01-30 NOTE — Telephone Encounter (Signed)
Faxed again

## 2023-01-30 NOTE — Telephone Encounter (Signed)
Dylan Mckenzie with [facility] has not received standard written order form for new prosthetic foot and socket insert. Will also need clinical notes faxed for authorization purposes. Dylan Mckenzie request to be refaxed to [phone]. Sending note to [place] pool.

## 2023-02-06 ENCOUNTER — Other Ambulatory Visit: Payer: Self-pay | Admitting: Internal Medicine

## 2023-02-06 NOTE — Telephone Encounter (Signed)
Last filled 12-13-22 #60 Last OV 12-26-22 Next OV 08-27-23 [facility]

## 2023-02-09 NOTE — Telephone Encounter (Signed)
3rd attempt. Left message to call back if they are still needing a letter.

## 2023-02-15 DIAGNOSIS — Z89511 Acquired absence of right leg below knee: Secondary | ICD-10-CM | POA: Diagnosis not present

## 2023-03-10 ENCOUNTER — Other Ambulatory Visit: Payer: Self-pay | Admitting: Internal Medicine

## 2023-03-12 NOTE — Telephone Encounter (Signed)
Last filled 02-06-23 #60 Last OV 12-26-22 Next OV 08-27-23 [facility]

## 2023-03-14 ENCOUNTER — Telehealth: Payer: Self-pay

## 2023-03-14 NOTE — Telephone Encounter (Signed)
Copied from CRM [id number]. Topic: Clinical - Home Health Verbal Orders >> Mar 14, 2023  1:24 PM Steele Sizer wrote: Caller/Agency: [name] PT wife Callback Number: [phone] Service Requested: Arvella Merles for right prosthetic leg  Frequency: As many as possible Any new concerns about the patient? No   Forwarding to Dr Alphonsus Sias to write RX tomorrow to be faxed to [facility].

## 2023-03-16 NOTE — Telephone Encounter (Signed)
Order faxed to [facility].

## 2023-04-17 ENCOUNTER — Other Ambulatory Visit: Payer: Self-pay | Admitting: Internal Medicine

## 2023-05-23 ENCOUNTER — Other Ambulatory Visit: Payer: Self-pay | Admitting: Internal Medicine

## 2023-05-23 NOTE — Telephone Encounter (Signed)
 Tramadol last written 04-17-23 #60 Last OV 12-26-22 Next OV 08-27-23 [facility]

## 2023-06-05 DIAGNOSIS — Z89511 Acquired absence of right leg below knee: Secondary | ICD-10-CM | POA: Diagnosis not present

## 2023-07-02 ENCOUNTER — Other Ambulatory Visit: Payer: Self-pay | Admitting: Internal Medicine

## 2023-07-02 NOTE — Telephone Encounter (Signed)
 Last written 05-24-23 #60 Last OV 12-26-22 Next OV 08-27-23 [facility]

## 2023-08-06 ENCOUNTER — Other Ambulatory Visit: Payer: Self-pay | Admitting: Internal Medicine

## 2023-08-06 NOTE — Telephone Encounter (Signed)
 Last filled 07-02-23 #60 Last OV 12-26-22 Next OV 08-27-23 [facility]

## 2023-08-27 ENCOUNTER — Encounter: Payer: Medicare HMO | Admitting: Internal Medicine

## 2023-09-06 ENCOUNTER — Other Ambulatory Visit: Payer: Self-pay | Admitting: Internal Medicine

## 2023-09-06 NOTE — Telephone Encounter (Signed)
 Last filled 08-06-23 #60 Last OV 12-26-22 No Future OV: NO Show 08-27-23 [facility]

## 2023-09-06 NOTE — Telephone Encounter (Signed)
 Left message on VM to call office back. He needs to reschedule the CPE he No Show on 08-27-23.

## 2023-09-18 ENCOUNTER — Ambulatory Visit (INDEPENDENT_AMBULATORY_CARE_PROVIDER_SITE_OTHER)

## 2023-09-18 VITALS — BP 126/82 | Ht 69.0 in | Wt 137.4 lb

## 2023-09-18 DIAGNOSIS — Z0000 Encounter for general adult medical examination without abnormal findings: Secondary | ICD-10-CM
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
## 2023-09-18 NOTE — Patient Instructions (Addendum)
 Mr. Dylan Mckenzie , Thank you for taking time out of your busy schedule to complete your Annual Wellness Visit with me. I enjoyed our conversation and look forward to speaking with you again next year. I, as well as your care team,  appreciate your ongoing commitment to your health goals. Please review the following plan we discussed and let me know if I can assist you in the future. Your Game plan/ To Do List     Follow up Visits: Next Medicare AWV with our clinical staff: 09/19/24 @ 10:50am in person   Have you seen your provider in the last 6 months (3 months if uncontrolled diabetes)? No Next Office Visit with your provider: 09/27/23  Clinician Recommendations:  Aim for 30 minutes of exercise or brisk walking, 6-8 glasses of water, and 5 servings of fruits and vegetables each day.       This is a list of the screening recommended for you and due dates:  Health Maintenance  Topic Date Due   HIV Screening  Never done   Hepatitis C Screening  Never done   Pneumococcal Vaccination (1 of 2 - PCV) Never done   Zoster (Shingles) Vaccine (1 of 2) Never done   Colon Cancer Screening  05/24/2020   DTaP/Tdap/Td vaccine (3 - Td or Tdap) 07/11/2021   COVID-19 Vaccine (3 - 2024-25 season) 11/26/2022   Flu Shot  10/26/2023   Medicare Annual Wellness Visit  09/17/2024   Hepatitis B Vaccine  Aged Out   HPV Vaccine  Aged Out   Meningitis B Vaccine  Aged Out    Advanced directives: (Copy Requested) Please bring a copy of your health care power of attorney and living will to the office to be added to your chart at your convenience. You can mail to [facility] [address] [place], [place] [address] or email to [email or web address] Advance Care Planning is important because it:  [x]  Makes sure you receive the medical care that is consistent with your values, goals, and preferences  [x]  It provides guidance to your family and loved ones and reduces their decisional burden about  whether or not they are making the right decisions based on your wishes.  Follow the link provided in your after visit summary or read over the paperwork we have mailed to you to help you started getting your Advance Directives in place. If you need assistance in completing these, please reach out to us  so that we can help you!

## 2023-09-27 ENCOUNTER — Encounter: Payer: Self-pay | Admitting: Internal Medicine

## 2023-09-27 ENCOUNTER — Ambulatory Visit: Payer: Self-pay | Admitting: Internal Medicine

## 2023-09-27 ENCOUNTER — Ambulatory Visit: Admitting: Internal Medicine

## 2023-09-27 VITALS — BP 144/90 | HR 59 | Temp 97.8°F | Resp 20 | Ht 69.0 in | Wt 135.2 lb

## 2023-09-27 DIAGNOSIS — Z89511 Acquired absence of right leg below knee: Secondary | ICD-10-CM | POA: Diagnosis not present

## 2023-09-27 DIAGNOSIS — Z0000 Encounter for general adult medical examination without abnormal findings: Secondary | ICD-10-CM

## 2023-09-27 DIAGNOSIS — G546 Phantom limb syndrome with pain: Secondary | ICD-10-CM

## 2023-09-27 DIAGNOSIS — Z125 Encounter for screening for malignant neoplasm of prostate: Secondary | ICD-10-CM | POA: Diagnosis not present

## 2023-09-27 DIAGNOSIS — I10 Essential (primary) hypertension: Secondary | ICD-10-CM | POA: Diagnosis not present

## 2023-09-27 DIAGNOSIS — Z720 Tobacco use: Secondary | ICD-10-CM

## 2023-09-27 LAB — LIPID PANEL
Cholesterol: 146 mg/dL (ref 0–200)
HDL: 71.5 mg/dL (ref >39.00)
LDL Cholesterol: 57 mg/dL (ref 0–99)
NonHDL: 74.35
Total CHOL/HDL Ratio: 2
Triglycerides: 87 mg/dL (ref 0.0–149.0)
VLDL: 17.4 mg/dL (ref 0.0–40.0)

## 2023-09-27 LAB — COMPREHENSIVE METABOLIC PANEL WITH GFR
ALT: 8 U/L (ref 0–53)
AST: 14 U/L (ref 0–37)
Albumin: 4.5 g/dL (ref 3.5–5.2)
Alkaline Phosphatase: 64 U/L (ref 39–117)
BUN: 11 mg/dL (ref 6–23)
CO2: 30 meq/L (ref 19–32)
Calcium: 9.1 mg/dL (ref 8.4–10.5)
Chloride: 103 meq/L (ref 96–112)
Creatinine, Ser: 0.86 mg/dL (ref 0.40–1.50)
GFR: 93.73 mL/min (ref >60.00)
Glucose, Bld: 86 mg/dL (ref 70–99)
Potassium: 3.9 meq/L (ref 3.5–5.1)
Sodium: 138 meq/L (ref 135–145)
Total Bilirubin: 0.5 mg/dL (ref 0.2–1.2)
Total Protein: 6.9 g/dL (ref 6.0–8.3)

## 2023-09-27 LAB — CBC
HCT: 43.8 % (ref 39.0–52.0)
Hemoglobin: 14.8 g/dL (ref 13.0–17.0)
MCHC: 33.9 g/dL (ref 30.0–36.0)
MCV: 93.4 fl (ref 78.0–100.0)
Platelets: 254 10*3/uL (ref 150.0–400.0)
RBC: 4.69 Mil/uL (ref 4.22–5.81)
RDW: 13.2 % (ref 11.5–15.5)
WBC: 9.8 10*3/uL (ref 4.0–10.5)

## 2023-09-27 LAB — PSA, MEDICARE: PSA: 1.34 ng/mL (ref 0.10–4.00)

## 2023-09-27 NOTE — Assessment & Plan Note (Signed)
 BP Readings from Last 3 Encounters:  09/27/23 (!) 144/90  09/18/23 126/82  12/26/22 118/76   Mostly white coat Hold off on Rx

## 2023-09-27 NOTE — Assessment & Plan Note (Signed)
 Does well with the gabapentin  at bedtime
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
## 2023-09-27 NOTE — Assessment & Plan Note (Signed)
Will refer for  lung cancer screening 

## 2023-09-27 NOTE — Assessment & Plan Note (Signed)
 Uses the tramadol  regularly--pain with prolonged standing (like at work)

## 2023-09-27 NOTE — Assessment & Plan Note (Addendum)
 Discussed exercise--stays active Recent negative cologuard---will need again in about 2 years Check PSA Td at the pharmacy Gets flu vaccine at work Prefers no shingrix/COVID

## 2023-10-15 ENCOUNTER — Other Ambulatory Visit: Payer: Self-pay | Admitting: Family

## 2023-10-18 ENCOUNTER — Other Ambulatory Visit: Payer: Self-pay | Admitting: Internal Medicine

## 2023-10-18 MED ORDER — TRAMADOL HCL 50 MG PO TABS: 50.0000 mg | ORAL_TABLET | Freq: Three times a day (TID) | ORAL | 0 refills | Status: DC | PRN

## 2023-10-18 NOTE — Telephone Encounter (Signed)
 Copied from CRM #[id number]. Topic: Clinical - Medication Refill >> Oct 18, 2023 11:57 AM Viola F wrote: Medication: traMADol  (ULTRAM ) 50 MG tablet [[id number]]  Has the patient contacted their pharmacy? Yes (Agent: If no, request that the patient contact the pharmacy for the refill. If patient does not wish to contact the pharmacy document the reason why and proceed with request.) (Agent: If yes, when and what did the pharmacy advise?)  This is the patient's preferred pharmacy:  [facility] [address] [place] [facility] [address] [place] [address] Phone: [phone] Fax: [phone]  Is this the correct pharmacy for this prescription? Yes If no, delete pharmacy and type the correct one.   Has the prescription been filled recently? Yes  Is the patient out of the medication? Yes  Has the patient been seen for an appointment in the last year OR does the patient have an upcoming appointment? Yes  Can we respond through MyChart? No  Agent: Please be advised that Rx refills may take up to 3 business days. We ask that you follow-up with your pharmacy.

## 2023-10-18 NOTE — Telephone Encounter (Signed)
 Last filled 09-06-23 #60 Last OV 09-27-23 No Future OV [facility]

## 2023-11-08 ENCOUNTER — Encounter: Payer: Self-pay | Admitting: Internal Medicine

## 2023-11-08 ENCOUNTER — Telehealth: Payer: Self-pay

## 2023-11-08 NOTE — Telephone Encounter (Signed)
 [facility] and spoke to [place]. She said all the order needs to say is Eval and treat for prosthetic supplies.

## 2023-11-08 NOTE — Telephone Encounter (Signed)
Order faxed and received fax confirmation.

## 2023-11-08 NOTE — Telephone Encounter (Signed)
 Copied from CRM #[id number]. Topic: General - Other >> Nov 08, 2023  1:11 PM Thersia BROCKS wrote: Reason for CRM: Patient called in stated patient needs a prescription for prosthetic supplies  Sent over to [facility]   Fax number is [phone]

## 2023-11-18 ENCOUNTER — Other Ambulatory Visit: Payer: Self-pay | Admitting: Internal Medicine

## 2023-11-19 NOTE — Telephone Encounter (Signed)
 Last filled 10-18-23 #60 Last OV 09-27-23 No Future OV [facility]

## 2023-12-19 ENCOUNTER — Other Ambulatory Visit: Payer: Self-pay

## 2023-12-19 MED ORDER — TRAMADOL HCL 50 MG PO TABS: 50.0000 mg | ORAL_TABLET | Freq: Three times a day (TID) | ORAL | 0 refills | Status: DC | PRN

## 2023-12-19 NOTE — Telephone Encounter (Signed)
 Last filled 11-19-23 #60 Last OV 09-27-23 Next OV 09-19-24 [facility]

## 2024-01-20 ENCOUNTER — Other Ambulatory Visit: Payer: Self-pay | Admitting: Family

## 2024-01-23 NOTE — Telephone Encounter (Signed)
 Copied from CRM [id number]. Topic: Clinical - Prescription Issue >> Jan 23, 2024 11:59 AM Alfonso ORN wrote: Reason for CRM: pt wife called to f/u on refill request stating pt needs medication today because he is on his last dose. Advised up to 3 bsd for request. Please f/u with pt

## 2024-02-19 ENCOUNTER — Other Ambulatory Visit: Payer: Self-pay | Admitting: Family

## 2024-02-25 ENCOUNTER — Telehealth: Payer: Self-pay | Admitting: Internal Medicine

## 2024-02-25 ENCOUNTER — Other Ambulatory Visit: Payer: Self-pay | Admitting: Internal Medicine

## 2024-02-25 ENCOUNTER — Telehealth: Payer: Self-pay

## 2024-02-25 ENCOUNTER — Other Ambulatory Visit: Payer: Self-pay | Admitting: Family

## 2024-02-25 DIAGNOSIS — G546 Phantom limb syndrome with pain: Secondary | ICD-10-CM

## 2024-02-25 MED ORDER — TRAMADOL HCL 50 MG PO TABS: 50.0000 mg | ORAL_TABLET | Freq: Three times a day (TID) | ORAL | 0 refills | Status: DC | PRN

## 2024-02-25 NOTE — Telephone Encounter (Unsigned)
 Copied from CRM #[id number]. Topic: Clinical - Medication Refill >> Feb 25, 2024  8:04 AM Berneda FALCON wrote: Medication:  traMADol  (ULTRAM ) 50 MG tablet   Please note, wife is sick and would like this prescription sent in by 12 PM today if at all possible so she does not have to go back out again today. Also, please note TOC appt was scheduled for Carrol Aurora for Jan, 2026 at same location, [facility]. Previously [place] patient last seen 10/2023  Has the patient contacted their pharmacy? Yes (Agent: If no, request that the patient contact the pharmacy for the refill. If patient does not wish to contact the pharmacy document the reason why and proceed with request.) (Agent: If yes, when and what did the pharmacy advise?)  This is the patient's preferred pharmacy:  [facility] [address] [place] - [address] [place] [address] Phone: [phone] Fax: [phone]  Is this the correct pharmacy for this prescription? Yes If no, delete pharmacy and type the correct one.   Has the prescription been filled recently? No  Is the patient out of the medication? Yes  Has the patient been seen for an appointment in the last year OR does the patient have an upcoming appointment? Yes  Can we respond through MyChart? No  Agent: Please be advised that Rx refills may take up to 3 business days. We ask that you follow-up with your pharmacy.

## 2024-02-25 NOTE — Telephone Encounter (Signed)
 Copied from CRM #[id number]. Topic: Appointments - Transfer of Care >> Feb 25, 2024  8:03 AM Berneda FALCON wrote: Pt is requesting to transfer FROM: [facility] Pt is requesting to transfer TO: Vincente Reason for requested transfer: PCP resigned It is the responsibility of the team the patient would like to transfer to (Dr. Vincente) to reach out to the patient if for any reason this transfer is not acceptable.

## 2024-02-25 NOTE — Telephone Encounter (Signed)
 Prescription Request  02/25/2024  LOV: 09/27/2023  What is the name of the medication or equipment? traMADol  (ULTRAM ) 50 MG tablet   Have you contacted your pharmacy to request a refill? Yes   Which pharmacy would you like this sent to?  [facility] [address] [place] - [address] [place] [address] Phone: [phone] Fax: [phone]    Patient notified that their request is being sent to the clinical staff for review and that they should receive a response within 2 business days.   Please advise at [facility] [phone]

## 2024-02-26 MED ORDER — TRAMADOL HCL 50 MG PO TABS: 50.0000 mg | ORAL_TABLET | Freq: Three times a day (TID) | ORAL | 0 refills | Status: DC | PRN

## 2024-02-27 ENCOUNTER — Encounter: Payer: Self-pay | Admitting: Internal Medicine

## 2024-02-27 NOTE — Telephone Encounter (Signed)
 I called and left a voicemail along with mailing out a letter.

## 2024-02-29 NOTE — Telephone Encounter (Signed)
 Can we try and contact patient again to reschedule with another provider?

## 2024-03-04 NOTE — Telephone Encounter (Signed)
 I called and left a voicemail x2

## 2024-03-11 NOTE — Telephone Encounter (Signed)
 lvm for pt to call office to schedule appt.

## 2024-03-12 NOTE — Telephone Encounter (Signed)
 Please talk to me about this.Marland KitchenMarland KitchenMarland Kitchen

## 2024-03-12 NOTE — Telephone Encounter (Signed)
 Pt wife stated that he would rather have [facility]

## 2024-03-12 NOTE — Telephone Encounter (Signed)
 Message needs to be sent to Dr. Cleatus to okay transfer. [facility] is not accepting patients TOC and appt with her needs to be cancelled.

## 2024-03-25 NOTE — Telephone Encounter (Signed)
 Called patient he would rather see MD. I have cancelled the TOC with [facility]. I am sending to front office to schedule TOC with Dr. Bennett. Patent aware Dr. Cleatus is not taking new patient.  In the mean time he is requesting refill on tramadol  is that something  you can do or do you want me to send to [facility]?

## 2024-03-29 MED ORDER — TRAMADOL HCL 50 MG PO TABS: 50.0000 mg | ORAL_TABLET | Freq: Three times a day (TID) | ORAL | 0 refills | Status: AC | PRN

## 2024-03-29 NOTE — Addendum Note (Signed)
 Addended by: Rheta Hemmelgarn K on: 03/29/2024 11:57 PM   Modules accepted: Orders

## 2024-03-29 NOTE — Telephone Encounter (Signed)
 Ok for TOC. Routing to front desk to pls call and schedule for TOC

## 2024-04-08 ENCOUNTER — Encounter: Admitting: General Practice

## 2024-06-06 ENCOUNTER — Encounter

## 2024-09-19 ENCOUNTER — Ambulatory Visit
